# Patient Record
Sex: Male | Born: 1950 | Race: White | Hispanic: No | Marital: Married | Smoking: Current every day smoker
Health system: Southern US, Community
[De-identification: ages and names within clinical notes are randomized; demographics above are authoritative.]

## PROBLEM LIST (undated history)

## (undated) DIAGNOSIS — I10 Essential (primary) hypertension: Secondary | ICD-10-CM

## (undated) DIAGNOSIS — F17200 Nicotine dependence, unspecified, uncomplicated: Secondary | ICD-10-CM

## (undated) HISTORY — DX: Essential (primary) hypertension: I10

---

## 2000-11-01 ENCOUNTER — Encounter: Payer: Self-pay | Admitting: Family Medicine

## 2000-11-01 ENCOUNTER — Encounter: Admission: RE | Admit: 2000-11-01 | Discharge: 2000-11-01 | Payer: Self-pay | Admitting: Family Medicine

## 2002-12-12 ENCOUNTER — Encounter: Admission: RE | Admit: 2002-12-12 | Discharge: 2002-12-12 | Payer: Self-pay | Admitting: Family Medicine

## 2002-12-12 ENCOUNTER — Encounter: Payer: Self-pay | Admitting: Family Medicine

## 2019-04-01 ENCOUNTER — Emergency Department (HOSPITAL_COMMUNITY): Payer: Medicare PPO

## 2019-04-01 ENCOUNTER — Other Ambulatory Visit: Payer: Self-pay

## 2019-04-01 ENCOUNTER — Emergency Department (HOSPITAL_COMMUNITY)
Admission: EM | Admit: 2019-04-01 | Discharge: 2019-04-01 | Disposition: A | Discharge status: Home / Self Care | Payer: Medicare PPO | Attending: Emergency Medicine | Admitting: Emergency Medicine

## 2019-04-01 ENCOUNTER — Encounter (HOSPITAL_COMMUNITY): Payer: Self-pay | Admitting: Emergency Medicine

## 2019-04-01 DIAGNOSIS — M25512 Pain in left shoulder: Secondary | ICD-10-CM | POA: Diagnosis present

## 2019-04-01 DIAGNOSIS — Z79899 Other long term (current) drug therapy: Secondary | ICD-10-CM | POA: Diagnosis not present

## 2019-04-01 DIAGNOSIS — M19012 Primary osteoarthritis, left shoulder: Secondary | ICD-10-CM | POA: Diagnosis not present

## 2019-04-01 DIAGNOSIS — F17210 Nicotine dependence, cigarettes, uncomplicated: Secondary | ICD-10-CM | POA: Insufficient documentation

## 2019-04-01 DIAGNOSIS — R0789 Other chest pain: Secondary | ICD-10-CM | POA: Diagnosis not present

## 2019-04-01 HISTORY — DX: Nicotine dependence, unspecified, uncomplicated: F17.200

## 2019-04-01 LAB — BASIC METABOLIC PANEL
Anion gap: 12 (ref 5–15)
BUN: 10 mg/dL (ref 8–23)
CO2: 26 mmol/L (ref 22–32)
Calcium: 9.5 mg/dL (ref 8.9–10.3)
Chloride: 102 mmol/L (ref 98–111)
Creatinine, Ser: 1.04 mg/dL (ref 0.61–1.24)
GFR calc Af Amer: >60 mL/min (ref >60)
GFR calc non Af Amer: >60 mL/min (ref >60)
Glucose, Bld: 101 mg/dL — ABNORMAL HIGH (ref 70–99)
Potassium: 3.7 mmol/L (ref 3.5–5.1)
Sodium: 140 mmol/L (ref 135–145)

## 2019-04-01 LAB — CBC
HCT: 44.2 % (ref 39.0–52.0)
Hemoglobin: 14.7 g/dL (ref 13.0–17.0)
MCH: 29.8 pg (ref 26.0–34.0)
MCHC: 33.3 g/dL (ref 30.0–36.0)
MCV: 89.7 fL (ref 80.0–100.0)
Platelets: 269 10*3/uL (ref 150–400)
RBC: 4.93 MIL/uL (ref 4.22–5.81)
RDW: 13.2 % (ref 11.5–15.5)
WBC: 12.6 10*3/uL — ABNORMAL HIGH (ref 4.0–10.5)
nRBC: 0 % (ref 0.0–0.2)

## 2019-04-01 LAB — TROPONIN I: Troponin I: <0.03 ng/mL (ref <0.03)

## 2019-04-01 MED ORDER — ASPIRIN 81 MG PO CHEW
324.0000 mg | CHEWABLE_TABLET | Freq: Once | ORAL | Status: DC
  Filled 2019-04-01: qty 4

## 2019-04-01 NOTE — Discharge Instructions (Signed)
Follow up with your doctor as discussed. Return to the ER for any new or worsening symptoms for further evaluation.

## 2019-04-01 NOTE — ED Triage Notes (Signed)
Pt reports he has left upper arm pain to about a 2 inch radius that is not radiating, no association with movement and no palpitations or chest pain. Pt states he has arthritis and thinks it could be related.

## 2019-04-01 NOTE — ED Provider Notes (Signed)
[facility] EMERGENCY DEPARTMENT Provider Note   CSN: [id number] Arrival date & time: 04/01/19  1041    History   Chief Complaint Chief Complaint  Patient presents with   Arm Pain    HPI Jermaine Wilson is a 68 y.o. male.  HPI: A 68 year old patient presents for evaluation of chest pain. Initial onset of pain was approximately 3-6 hours ago. The patient's chest pain is not worse with exertion. The patient's chest pain is not middle- or left-sided, is not well-localized, is not described as heaviness/pressure/tightness, is not sharp and does not radiate to the arms/jaw/neck. The patient does not complain of nausea and denies diaphoresis. The patient has smoked in the past 90 days. The patient has no history of stroke, has no history of peripheral artery disease, denies any history of treated diabetes, has no relevant family history of coronary artery disease (first degree relative at less than age 30), is not hypertensive, has no history of hypercholesterolemia and does not have an elevated BMI (>=30).   68 year old male presents with complaint of left shoulder discomfort.  Patient reports intermittent discomfort in his left proximal lateral humerus/deltoid area for the past 4 to 6 months.  Patient states that he notices this discomfort on average once weekly, rates discomfort as a 3-4 at most.  Patient states that he sleeps on his right side and has a hard time getting his left arm into a comfortable position to sleep at night, notices the discomfort periodically throughout the day, nothing makes pain better or worse, described as a bruised sensation.  Denies discomfort with exertion, denies chest pain, shortness of breath, diaphoresis, nausea.  No significant past medical history, states possibly hypertension and high cholesterol at one point however improved with diet and no longer a problem, not on medication.  Patient is a pack-a-day smoker.  No significant family history.   Patient noticed the discomfort in his shoulder today and called his doctor's office with hopes for an appointment tomorrow however was advised to come to the ER with complaint of left shoulder pain.  Patient attributes his elevated heart rate and blood pressure on arrival to feeling anxious.  At the time of exam patient does not have any discomfort in his shoulder. No other compalints or concerns.      Past Medical History:  Diagnosis Date   Smoker     There are no active problems to display for this patient.   History reviewed. No pertinent surgical history.      Home Medications    Prior to Admission medications   Medication Sig Start Date End Date Taking? Authorizing Provider  cholecalciferol (VITAMIN D) 25 MCG (1000 UT) tablet Take 1,000 Units by mouth daily.   Yes [provider]  diclofenac (VOLTAREN) 50 MG EC tablet Take 50 mg by mouth 2 (two) times daily.  10/31/18  Yes [provider]  Multiple Vitamins-Minerals (MULTIVITAMIN WITH MINERALS) tablet Take 1 tablet by mouth daily.   Yes [provider]  Omega-3 Fatty Acids (FISH OIL) 1000 MG CAPS Take 1,000 mg by mouth daily.   Yes [provider]    Family History No family history on file.  Social History Social History   Tobacco Use   Smoking status: Not on file  Substance Use Topics   Alcohol use: Not on file   Drug use: Not on file     Allergies   Patient has no known allergies.   Review of Systems Review of  Systems  Constitutional: Negative for chills, diaphoresis and fever.  Respiratory: Negative for chest tightness and shortness of breath.   Cardiovascular: Negative for chest pain and palpitations.  Gastrointestinal: Negative for nausea and vomiting.  Musculoskeletal: Positive for myalgias. Negative for joint swelling, neck pain and neck stiffness.  Skin: Negative for color change, rash and wound.  Allergic/Immunologic: Negative for immunocompromised state.    Neurological: Negative for dizziness, weakness and numbness.  Hematological: Negative for adenopathy.  All other systems reviewed and are negative.    Physical Exam Updated Vital Signs BP 139/87 (BP Location: Right Arm)    Pulse 89    Temp 98.2 F (36.8 C) (Oral)    Resp 18    Ht 5\' 9"  (1.753 m)    Wt 83.9 kg    SpO2 96%    BMI 27.32 kg/m   Physical Exam Vitals signs and nursing note reviewed.  Constitutional:      General: He is not in acute distress.    Appearance: He is well-developed. He is not diaphoretic.  HENT:     Head: Normocephalic and atraumatic.  Cardiovascular:     Rate and Rhythm: Normal rate and regular rhythm.     Pulses: Normal pulses.     Heart sounds: Normal heart sounds.  Pulmonary:     Effort: Pulmonary effort is normal.     Breath sounds: Normal breath sounds.  Musculoskeletal: Normal range of motion.        General: No swelling, tenderness or deformity.     Comments: Notes discomfort after ROM  Skin:    General: Skin is warm and dry.     Findings: No erythema or rash.  Neurological:     General: No focal deficit present.     Mental Status: He is alert and oriented to person, place, and time.     Sensory: No sensory deficit.  Psychiatric:        Behavior: Behavior normal.      ED Treatments / Results  Labs (all labs ordered are listed, but only abnormal results are displayed) Labs Reviewed  BASIC METABOLIC PANEL - Abnormal; Notable for the following components:      Result Value   Glucose, Bld 101 (*)    All other components within normal limits  CBC - Abnormal; Notable for the following components:   WBC 12.6 (*)    All other components within normal limits  TROPONIN I  TROPONIN I    EKG EKG Interpretation  Date/Time:  Sunday Apr 01 2019 11:43:03 EDT Ventricular Rate:  94 PR Interval:    QRS Duration: 111 QT Interval:  361 QTC Calculation: 452 R Axis:   73 Text Interpretation:  Sinus rhythm no prior to compare with Confirmed  by Meridee ScoreButler, Michael [phone]) on 04/01/2019 11:46:33 AM Also confirmed by Meridee ScoreButler, Michael [phone]), editor Barbette Hairassel, Kerry [phone]([id number])  on 04/01/2019 11:59:23 AM   Radiology Dg Chest 2 View  Result Date: 04/01/2019 CLINICAL DATA:  Left upper arm pain. EXAM: CHEST - 2 VIEW COMPARISON:  None. FINDINGS: There is a calcified nodule in the left base. The heart, hila, and mediastinum are normal. No pneumothorax. No suspicious nodules, masses, or focal infiltrates. IMPRESSION: No active cardiopulmonary disease. Electronically Signed   By: Gerome Samavid  Williams III M.D   On: 04/01/2019 12:01   Dg Shoulder Left  Result Date: 04/01/2019 CLINICAL DATA:  Left upper arm pain. EXAM: LEFT SHOULDER - 2+ VIEW COMPARISON:  None. FINDINGS: Mild AC joint degenerative changes. Degenerative changes  at the rotator cuff insertion site. No fracture or dislocation. IMPRESSION: Degenerative changes. Electronically Signed   By: Gerome Sam III M.D   On: 04/01/2019 12:02    Procedures Procedures (including critical care time)  Medications Ordered in ED Medications  aspirin chewable tablet 324 mg (324 mg Oral Not Given 04/01/19 1222)     Initial Impression / Assessment and Plan / ED Course  I have reviewed the triage vital signs and the nursing notes.  Pertinent labs & imaging results that were available during my care of the patient were reviewed by me and considered in my medical decision making (see chart for details).  Clinical Course as of Mar 31 1349  Sun Apr 01, 2019  4636 68 year old male presents with complaint of a dull left shoulder discomfort intermittent x4 to 6 months without injury.  Patient denies chest pain or other associated symptoms.  On exam there is no tenderness in the area of patient's deltoid where he says the discomfort occurs, he does not have pain at this time however after performing range of motion of the shoulder he reports the area is uncomfortable again.  Patient is a daily smoker, no other cardiac  risk factors, no significant past medical history or family history.  EKG unremarkable, no previous for comparison, no acute ischemic changes.  Troponin x1-, BMP within normal limits, CBC with mild leukocytosis without infection concerns.  Chest x-ray unremarkable, x-ray of left shoulder shows arthritic changes.  Reviewed results with patient, recommend repeat troponin at 3 hours, patient declines discussed with patient concern for discomfort that has been ongoing for 4 to 6 months which prompted him to call his doctor today and follow through with ER visit, patient truly feels like there was nothing new or different about his discomfort that prompted the phone call and nothing about his symptoms are concerning to him at this point.  Patient would like to follow-up with his PCP and agrees to return to the ER should he have any new or worsening of his symptoms.  Patient understands his cardiac workup today is incomplete, repeat troponin could be elevated and would change the disposition for today, he would still like to be discharged today and understands the decision he has made.    [LM]    Clinical Course User Index [LM] Alden Hipp    [facility] Score: 3   Final Clinical Impressions(s) / ED Diagnoses   Final diagnoses:  Left shoulder pain, unspecified chronicity  Osteoarthritis of left shoulder, unspecified osteoarthritis type    ED Discharge Orders    None       Jeannie Fend, PA-C 04/01/19 1351    Terrilee Files, MD 04/03/19 [phone]

## 2019-12-12 ENCOUNTER — Ambulatory Visit: Payer: Medicare PPO

## 2019-12-20 ENCOUNTER — Ambulatory Visit: Payer: Medicare PPO

## 2019-12-24 ENCOUNTER — Ambulatory Visit: Payer: Medicare PPO | Attending: Internal Medicine

## 2019-12-24 DIAGNOSIS — Z23 Encounter for immunization: Secondary | ICD-10-CM | POA: Insufficient documentation

## 2019-12-24 NOTE — Progress Notes (Signed)
   Covid-19 Vaccination Clinic  Name:  Jermaine Wilson    MRN: [id number] DOB: 1951/09/19  12/24/2019  [name] was observed post Covid-19 immunization for 15 minutes without incidence. He was provided with Vaccine Information Sheet and instruction to access the V-Safe system.   [name] was instructed to call 911 with any severe reactions post vaccine: Marland Kitchen Difficulty breathing  . Swelling of your face and throat  . A fast heartbeat  . A bad rash all over your body  . Dizziness and weakness    Immunizations Administered    Name Date Dose VIS Date Route   Pfizer COVID-19 Vaccine 12/24/2019  9:16 AM 0.3 mL 10/26/2019 Intramuscular   Manufacturer: [employer], [employer]   Lot: [id number]   NDC: [address]-7

## 2020-01-02 ENCOUNTER — Ambulatory Visit: Payer: Medicare PPO

## 2020-01-17 ENCOUNTER — Ambulatory Visit: Payer: Medicare PPO | Attending: Internal Medicine

## 2020-01-17 DIAGNOSIS — Z23 Encounter for immunization: Secondary | ICD-10-CM | POA: Insufficient documentation

## 2020-01-17 NOTE — Progress Notes (Signed)
   Covid-19 Vaccination Clinic  Name:  Jermaine Wilson    MRN: [id number] DOB: 05/10/1951  01/17/2020  Mr. Jermaine Wilson was observed post Covid-19 immunization for 15 minutes without incident. He was provided with Vaccine Information Sheet and instruction to access the V-Safe system.   Mr. Jermaine Wilson was instructed to call 911 with any severe reactions post vaccine: Marland Kitchen Difficulty breathing  . Swelling of face and throat  . A fast heartbeat  . A bad rash all over body  . Dizziness and weakness   Immunizations Administered    Name Date Dose VIS Date Route   Pfizer COVID-19 Vaccine 01/17/2020  4:28 PM 0.3 mL 10/26/2019 Intramuscular   Manufacturer: [employer], [employer]   Lot: [id number]   NDC: [address]-6

## 2021-01-15 ENCOUNTER — Other Ambulatory Visit: Payer: Self-pay

## 2021-01-15 ENCOUNTER — Encounter: Payer: Self-pay | Admitting: Podiatry

## 2021-01-15 ENCOUNTER — Ambulatory Visit: Payer: Medicare PPO | Admitting: Podiatry

## 2021-01-15 ENCOUNTER — Telehealth: Payer: Self-pay | Admitting: Podiatry

## 2021-01-15 DIAGNOSIS — M79674 Pain in right toe(s): Secondary | ICD-10-CM | POA: Diagnosis not present

## 2021-01-15 DIAGNOSIS — M79675 Pain in left toe(s): Secondary | ICD-10-CM

## 2021-01-15 DIAGNOSIS — B351 Tinea unguium: Secondary | ICD-10-CM | POA: Diagnosis not present

## 2021-01-15 DIAGNOSIS — L600 Ingrowing nail: Secondary | ICD-10-CM | POA: Diagnosis not present

## 2021-01-15 NOTE — Telephone Encounter (Signed)
Pt had questions about soaking instructions. I went over them with him, and he verbalized that he understood.

## 2021-01-15 NOTE — Progress Notes (Signed)
Subjective:   Patient ID: Jermaine Wilson, male   DOB: 70 y.o.   MRN: [id number]   HPI Patient states he has severe nail trauma to his hallux bilateral with abnormal growth pain with palpation and is also noted to have elongated nails that are painful yellow with brittle debris noted.  He states its been going on a long time and he does not remember specific trauma and patient does not currently smoke likes to be active if possible   Review of Systems  All other systems reviewed and are negative.       Objective:  Physical Exam Vitals and nursing note reviewed.  Constitutional:      Appearance: He is well-developed and well-nourished.  Cardiovascular:     Pulses: Intact distal pulses.  Pulmonary:     Effort: Pulmonary effort is normal.  Musculoskeletal:        General: Normal range of motion.  Skin:    General: Skin is warm.  Neurological:     Mental Status: He is alert.     Neurovascular status intact muscle strength was found to be adequate range of motion adequate.  Patient is noted to have severe damage thickness of the hallux nails bilateral that are dystrophic painful when palpated and is noted on the adjacent nails 2 through 5 to be elongated irritated and painful.  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  I recommended nail removal at this time and patient wants this done and I explained procedure risk associated with this and he signed consent form after education.  Today I infiltrated each hallux 60 mg like Marcaine mixture sterile prep done and using sterile instrumentation remove the hallux nail exposed matrix and applied phenol for applications 30 seconds followed by alcohol lavage sterile dressing.  Gave instructions on soaks and I debrided nails 2 through 5 bilateral no iatrogenic bleeding  reappoint routine care encouraged to leave dressings on 24 hours but take them off earlier if needed and encouraged him to call questions concerns

## 2021-01-15 NOTE — Patient Instructions (Signed)

## 2021-02-13 HISTORY — PX: TOTAL HIP ARTHROPLASTY: SHX124

## 2021-03-16 ENCOUNTER — Observation Stay (HOSPITAL_COMMUNITY)
Admission: EM | Admit: 2021-03-16 | Discharge: 2021-03-17 | Disposition: A | Discharge status: Home / Self Care | Payer: Medicare PPO | Attending: Family Medicine | Admitting: Family Medicine

## 2021-03-16 ENCOUNTER — Other Ambulatory Visit: Payer: Self-pay

## 2021-03-16 ENCOUNTER — Encounter (HOSPITAL_COMMUNITY): Payer: Self-pay

## 2021-03-16 ENCOUNTER — Emergency Department (HOSPITAL_COMMUNITY): Payer: Medicare PPO

## 2021-03-16 DIAGNOSIS — R002 Palpitations: Secondary | ICD-10-CM | POA: Diagnosis present

## 2021-03-16 DIAGNOSIS — Z20822 Contact with and (suspected) exposure to covid-19: Secondary | ICD-10-CM | POA: Diagnosis not present

## 2021-03-16 DIAGNOSIS — I4891 Unspecified atrial fibrillation: Secondary | ICD-10-CM | POA: Diagnosis not present

## 2021-03-16 DIAGNOSIS — Z96641 Presence of right artificial hip joint: Secondary | ICD-10-CM | POA: Insufficient documentation

## 2021-03-16 DIAGNOSIS — R0602 Shortness of breath: Secondary | ICD-10-CM | POA: Insufficient documentation

## 2021-03-16 DIAGNOSIS — I480 Paroxysmal atrial fibrillation: Secondary | ICD-10-CM

## 2021-03-16 LAB — CBC WITH DIFFERENTIAL/PLATELET
Abs Immature Granulocytes: 0.03 10*3/uL (ref 0.00–0.07)
Basophils Absolute: 0.1 10*3/uL (ref 0.0–0.1)
Basophils Relative: 1 %
Eosinophils Absolute: 0.2 10*3/uL (ref 0.0–0.5)
Eosinophils Relative: 2 %
HCT: 44.3 % (ref 39.0–52.0)
Hemoglobin: 14.1 g/dL (ref 13.0–17.0)
Immature Granulocytes: 0 %
Lymphocytes Relative: 17 %
Lymphs Abs: 2.1 10*3/uL (ref 0.7–4.0)
MCH: 30.5 pg (ref 26.0–34.0)
MCHC: 31.8 g/dL (ref 30.0–36.0)
MCV: 95.9 fL (ref 80.0–100.0)
Monocytes Absolute: 0.9 10*3/uL (ref 0.1–1.0)
Monocytes Relative: 7 %
Neutro Abs: 9 10*3/uL — ABNORMAL HIGH (ref 1.7–7.7)
Neutrophils Relative %: 73 %
Platelets: 451 10*3/uL — ABNORMAL HIGH (ref 150–400)
RBC: 4.62 MIL/uL (ref 4.22–5.81)
RDW: 13.7 % (ref 11.5–15.5)
WBC: 12.3 10*3/uL — ABNORMAL HIGH (ref 4.0–10.5)
nRBC: 0 % (ref 0.0–0.2)

## 2021-03-16 LAB — COMPREHENSIVE METABOLIC PANEL
ALT: 21 U/L (ref 0–44)
AST: 20 U/L (ref 15–41)
Albumin: 3.8 g/dL (ref 3.5–5.0)
Alkaline Phosphatase: 96 U/L (ref 38–126)
Anion gap: 10 (ref 5–15)
BUN: 11 mg/dL (ref 8–23)
CO2: 26 mmol/L (ref 22–32)
Calcium: 9.7 mg/dL (ref 8.9–10.3)
Chloride: 102 mmol/L (ref 98–111)
Creatinine, Ser: 1 mg/dL (ref 0.61–1.24)
GFR, Estimated: >60 mL/min (ref >60)
Glucose, Bld: 115 mg/dL — ABNORMAL HIGH (ref 70–99)
Potassium: 4.1 mmol/L (ref 3.5–5.1)
Sodium: 138 mmol/L (ref 135–145)
Total Bilirubin: 1 mg/dL (ref 0.3–1.2)
Total Protein: 6.9 g/dL (ref 6.5–8.1)

## 2021-03-16 LAB — RESP PANEL BY RT-PCR (FLU A&B, COVID) ARPGX2
Influenza A by PCR: NEGATIVE
Influenza B by PCR: NEGATIVE
SARS Coronavirus 2 by RT PCR: NEGATIVE

## 2021-03-16 LAB — LIPID PANEL
Cholesterol: 235 mg/dL — ABNORMAL HIGH (ref 0–200)
HDL: 40 mg/dL — ABNORMAL LOW (ref >40)
LDL Cholesterol: 172 mg/dL — ABNORMAL HIGH (ref 0–99)
Total CHOL/HDL Ratio: 5.9 RATIO
Triglycerides: 115 mg/dL (ref <150)
VLDL: 23 mg/dL (ref 0–40)

## 2021-03-16 LAB — T4, FREE: Free T4: 1.18 ng/dL — ABNORMAL HIGH (ref 0.61–1.12)

## 2021-03-16 LAB — TSH: TSH: 0.569 u[IU]/mL (ref 0.350–4.500)

## 2021-03-16 MED ORDER — DICLOFENAC SODIUM 50 MG PO TBEC: 50.0000 mg | DELAYED_RELEASE_TABLET | Freq: Two times a day (BID) | ORAL | Status: DC

## 2021-03-16 MED ORDER — VITAMIN D 25 MCG (1000 UNIT) PO TABS
1000.0000 [IU] | ORAL_TABLET | Freq: Every day | ORAL | Status: DC
  Filled 2021-03-16: qty 1

## 2021-03-16 MED ORDER — OXYCODONE HCL 5 MG PO TABS: 5.0000 mg | ORAL_TABLET | Freq: Four times a day (QID) | ORAL | Status: DC | PRN

## 2021-03-16 MED ORDER — POLYETHYLENE GLYCOL 3350 17 G PO PACK: 17.0000 g | PACK | Freq: Every day | ORAL | Status: DC

## 2021-03-16 MED ORDER — ADULT MULTIVITAMIN W/MINERALS CH: 1.0000 | ORAL_TABLET | Freq: Every day | ORAL | Status: DC

## 2021-03-16 MED ORDER — DILTIAZEM HCL-DEXTROSE 125-5 MG/125ML-% IV SOLN (PREMIX)
5.0000 mg/h | INTRAVENOUS | Status: DC
  Administered 2021-03-16: 5 mg/h via INTRAVENOUS
  Administered 2021-03-17: 10 mg/h via INTRAVENOUS
  Filled 2021-03-16 (×2): qty 125

## 2021-03-16 MED ORDER — HEPARIN BOLUS VIA INFUSION
3000.0000 [IU] | Freq: Once | INTRAVENOUS | Status: AC
  Administered 2021-03-16: 3000 [IU] via INTRAVENOUS
  Filled 2021-03-16: qty 3000

## 2021-03-16 MED ORDER — ONDANSETRON HCL 4 MG/2ML IJ SOLN: 4.0000 mg | Freq: Four times a day (QID) | INTRAMUSCULAR | Status: DC | PRN

## 2021-03-16 MED ORDER — DOCUSATE SODIUM 100 MG PO CAPS: 100.0000 mg | ORAL_CAPSULE | Freq: Two times a day (BID) | ORAL | Status: DC | PRN

## 2021-03-16 MED ORDER — HEPARIN (PORCINE) 25000 UT/250ML-% IV SOLN
1300.0000 [IU]/h | INTRAVENOUS | Status: DC
  Administered 2021-03-16: 1100 [IU]/h via INTRAVENOUS
  Filled 2021-03-16: qty 250

## 2021-03-16 MED ORDER — SODIUM CHLORIDE 0.9 % IV SOLN: INTRAVENOUS | Status: DC

## 2021-03-16 MED ORDER — METOPROLOL TARTRATE 5 MG/5ML IV SOLN
5.0000 mg | Freq: Once | INTRAVENOUS | Status: AC
  Administered 2021-03-16: 5 mg via INTRAVENOUS
  Filled 2021-03-16: qty 5

## 2021-03-16 MED ORDER — IOHEXOL 350 MG/ML SOLN
75.0000 mL | Freq: Once | INTRAVENOUS | Status: AC | PRN
  Administered 2021-03-16: 75 mL via INTRAVENOUS

## 2021-03-16 MED ORDER — ACETAMINOPHEN 325 MG PO TABS: 650.0000 mg | ORAL_TABLET | ORAL | Status: DC | PRN

## 2021-03-16 NOTE — ED Provider Notes (Signed)
Emergency Medicine Provider Triage Evaluation Note  Jermaine Wilson 70 y.o. M  was evaluated in triage.  Pt complains of palpitations. Patient sent over by PCP who he was seeing for evaluation of his postop issues.  He reports that at PCPs office, he was found to be tachycardic and was sent to the ED for further evaluation.  He states he notes last night, he was having some palpitations.  No chest pain, shortness of breath.  No fevers, abdominal pain.  He states his incision site has been clean without any signs of erythema, warmth, drainage.  Review of Systems  Positive: Palpitations Negative: Chest pain, shortness of breath, abdominal pain, fever  Physical Exam  BP 134/82   Pulse 70   Temp 98.2 F (36.8 C) (Oral)   Resp 18   Ht 5\' 4"  (1.626 m)   Wt 65.8 kg   SpO2 100%   BMI 24.89 kg/m  Gen:   Awake, no distress   HEENT:  Atraumatic  Resp:  Normal effort  Cardiac:  Regular rhythm, tachycardic Abd:   Nondistended, nontender  MSK:   Moves extremities without difficulty  Neuro:  Speech clear   Medical Decision Making  Medically screening exam initiated at 3:55 AM.  Appropriate orders placed.  Jermaine Wilson was informed that the remainder of the evaluation will be completed by another provider, this initial triage assessment does not replace that evaluation, and the importance of remaining in the ED until their evaluation is complete.  12:09 PM: Notified charge nurse (renal) the patient was in SVT and should be evaluated in the ED.  Clinical Impression  Palpitations   Portions of this note were generated with Dragon dictation software. Dictation errors may occur despite best attempts at proofreading.     Joline Salt, PA-C 03/16/21 1210    05/16/21, MD 03/17/21 [phone]

## 2021-03-16 NOTE — Progress Notes (Signed)
ANTICOAGULATION CONSULT NOTE - Initial Consult  Pharmacy Consult for heparin Indication: atrial flutter vs. VTE  No Known Allergies  Patient Measurements: Height: 5\' 9"  (175.3 cm) Weight: 76.7 kg (169 lb) IBW/kg (Calculated) : 70.7 Heparin Dosing Weight: 76.7 kg  Vital Signs: Temp: 97.6 F (36.4 C) (05/02 1545) Temp Source: Oral (05/02 1545) BP: 117/96 (05/02 1530) Pulse Rate: 104 (05/02 1530)  Labs: Recent Labs    03/16/21 1227  HGB 14.1  HCT 44.3  PLT 451*  CREATININE 1.00    Estimated Creatinine Clearance: 67.8 mL/min (by C-G formula based on SCr of 1 mg/dL).   Medical History: Past Medical History:  Diagnosis Date  . Smoker     Assessment: 27 yom presenting with increased heart rate. Patient s/p hip surgery 3 weeks ago. Chest CT showed paratracheal mass, PE study was negative for PE. 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   Goal of Therapy:  Heparin level 0.3-0.7 units/ml Monitor platelets by anticoagulation protocol: Yes   Plan:  Heparin 3000 units x1  Followed by 1100 units/hr  8-hour heparin level  Daily CBC and HL  Monitor s/sx of bleed  60, PharmD, [facility] Resident [phone] 03/16/2021 4:17 PM

## 2021-03-16 NOTE — Consult Note (Addendum)
CARDIOLOGY CONSULT NOTE  Patient ID: Jermaine Wilson MRN: [id number] DOB/AGE: 72-Aug-1952 41 y.o.  Admit date: 03/16/2021 Referring Physician: [name], MD Reason for Consultation: Tachycardia  HPI:   70 y.o. Caucasian male smoker ,h/o right hip replacement surgery 02/2021, now admitted with A. fib RVR  Patient has been fairly active at baseline for the last 1 year or so when his activity is reduced due to right hip pain.  He underwent hip replacement surgery 3 weeks ago.  Today, he was visiting his PCP with primary complaint of constipation.  His PCP noticed him to be in A. fib with RVR, therefore sent to [facility], ER.  Nonspecific questioning, patient thinks that he started having tachycardia on Saturday, 03/15/2019 2 at night.  He denies any chest pain.  He has mild exertional dyspnea at baseline for many years, with no recent change.  While in the ER, patient was found to be in A. Fib/atrial flutter with RVR.  He was started on IV heparin and diltiazem.  He also underwent CT angiogram of his chest to rule out PE.  There was no PE seen, there was an incidental finding of left paratracheal mass measuring up to 25 mm in diameter-favor exophytic thyroid mass however necrotic lymph node also possible. Recommend thyroid ultrasound. TSH was low normal at 0.5, free T4 mildly elevated at 1.18.   Past Medical History:  Diagnosis Date  . Smoker      Past Surgical History:  Procedure Laterality Date  . TOTAL HIP ARTHROPLASTY Right 02/2021     History reviewed. No pertinent family history.   Social History: Social History   Socioeconomic History  . Marital status: Married    Spouse name: Not on file  . Number of children: Not on file  . Years of education: Not on file  . Highest education level: Not on file  Occupational History  . Not on file  Tobacco Use  . Smoking status: Current Every Day Smoker    Types: Cigarettes  . Smokeless tobacco: Not on file  Substance and Sexual  Activity  . Alcohol use: Not on file  . Drug use: Not on file  . Sexual activity: Not on file  Other Topics Concern  . Not on file  Social History Narrative  . Not on file   Social Determinants of Health   Financial Resource Strain: Not on file  Food Insecurity: Not on file  Transportation Needs: Not on file  Physical Activity: Not on file  Stress: Not on file  Social Connections: Not on file  Intimate Partner Violence: Not on file     Medications Prior to Admission  Medication Sig Dispense Refill Last Dose  . acetaminophen (TYLENOL) 500 MG tablet Take 500 mg by mouth daily as needed (pain).   03/15/2021 at Unknown time  . aspirin 325 MG tablet Take 325 mg by mouth 2 (two) times daily.   03/16/2021 at Unknown time  . cholecalciferol (VITAMIN D) 25 MCG (1000 UT) tablet Take 1,000 Units by mouth daily.   Past Month at Unknown time  . diphenhydramine-acetaminophen (TYLENOL PM) 25-500 MG TABS tablet Take 1 tablet by mouth at bedtime as needed (pain).   03/15/2021 at Unknown time  . Multiple Vitamins-Minerals (MULTIVITAMIN WITH MINERALS) tablet Take 1 tablet by mouth daily.   03/16/2021 at Unknown time  . Omega-3 Fatty Acids (FISH OIL) 1000 MG CAPS Take 1,000 mg by mouth daily.   unknown  . tiZANidine (ZANAFLEX) 4 MG tablet Take  4 mg by mouth 2 (two) times daily.   03/16/2021 at Unknown time  . diclofenac (VOLTAREN) 50 MG EC tablet Take 50 mg by mouth 3 (three) times daily.   on hold at on hold    Review of Systems  Constitutional: Negative for decreased appetite, malaise/fatigue, weight gain and weight loss.  HENT: Negative for congestion.   Eyes: Negative for visual disturbance.  Cardiovascular: Negative for chest pain, dyspnea on exertion, leg swelling, palpitations and syncope.  Respiratory: Negative for cough.   Endocrine: Negative for cold intolerance.  Hematologic/Lymphatic: Does not bruise/bleed easily.  Skin: Negative for itching and rash.  Musculoskeletal: Negative for myalgias.   Gastrointestinal: Negative for abdominal pain, nausea and vomiting.  Genitourinary: Negative for dysuria.  Neurological: Negative for dizziness and weakness.  Psychiatric/Behavioral: The patient is not nervous/anxious.   All other systems reviewed and are negative.     Physical Exam: Physical Exam Vitals and nursing note reviewed.  Constitutional:      General: He is not in acute distress.    Appearance: He is well-developed.  HENT:     Head: Normocephalic and atraumatic.  Eyes:     Conjunctiva/sclera: Conjunctivae normal.     Pupils: Pupils are equal, round, and reactive to light.  Neck:     Vascular: No JVD.  Cardiovascular:     Rate and Rhythm: Tachycardia present. Rhythm irregular.     Pulses: Normal pulses and intact distal pulses.     Heart sounds: No murmur heard.   Pulmonary:     Effort: Pulmonary effort is normal.     Breath sounds: Normal breath sounds. No wheezing or rales.  Abdominal:     General: Bowel sounds are normal.     Palpations: Abdomen is soft.     Tenderness: There is no rebound.  Musculoskeletal:        General: No tenderness. Normal range of motion.     Left lower leg: No edema.  Lymphadenopathy:     Cervical: No cervical adenopathy.  Skin:    General: Skin is warm and dry.  Neurological:     Mental Status: He is alert and oriented to person, place, and time.     Cranial Nerves: No cranial nerve deficit.      Labs:   Lab Results  Component Value Date   WBC 12.3 (H) 03/16/2021   HGB 14.1 03/16/2021   HCT 44.3 03/16/2021   MCV 95.9 03/16/2021   PLT 451 (H) 03/16/2021    Recent Labs  Lab 03/16/21 1227  NA 138  K 4.1  CL 102  CO2 26  BUN 11  CREATININE 1.00  CALCIUM 9.7  PROT 6.9  BILITOT 1.0  ALKPHOS 96  ALT 21  AST 20  GLUCOSE 115*    Lipid Panel     Component Value Date/Time   CHOL 235 (H) 03/16/2021 1624   TRIG 115 03/16/2021 1624   HDL 40 (L) 03/16/2021 1624   CHOLHDL 5.9 03/16/2021 1624   VLDL 23  03/16/2021 1624   LDLCALC 172 (H) 03/16/2021 1624    BNP (last 3 results) N/A  HEMOGLOBIN A1C N/A  Cardiac Panel (last 3 results) N/A  TSH Recent Labs    03/16/21 1229  TSH 0.569      Radiology: CT Angio Chest PE W/Cm &/Or Wo Cm  Result Date: 03/16/2021 CLINICAL DATA:  Short of breath. Tachycardia rule out pulmonary embolism. EXAM: CT ANGIOGRAPHY CHEST WITH CONTRAST TECHNIQUE: Multidetector CT imaging of the chest was  performed using the standard protocol during bolus administration of intravenous contrast. Multiplanar CT image reconstructions and MIPs were obtained to evaluate the vascular anatomy. CONTRAST:  31mL OMNIPAQUE IOHEXOL 350 MG/ML SOLN COMPARISON:  Chest x-ray 03/16/2021 FINDINGS: Cardiovascular: Satisfactory enhancement of pulmonary arteries. Negative for pulmonary embolism. No significant enhancement of the thoracic aorta. There is mild atherosclerotic disease in the aorta without aneurysm. There is atherosclerotic calcification in the left coronary artery. No pericardial effusion. 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 This is low density centrally and may be attached to the posterior left thyroid. No other mediastinal mass or adenopathy identified. Lungs/Pleura: Calcified granuloma left lower lobe. No acute infiltrate or effusion. Negative for mass lesion. Upper Abdomen: Negative Musculoskeletal: Thoracic disc degeneration and anterior spurring. No acute skeletal abnormality. Review of the MIP images confirms the above findings. IMPRESSION: Negative for pulmonary embolism. Atherosclerotic calcification aortic arch and coronary arteries. 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 Recommend thyroid ultrasound. (Ref: J Am Coll Radiol. 2015 Feb;12(2): 143-50). Electronically Signed   By: Marlan Palau M.D.   On: 03/16/2021 15:38   DG Chest Port 1  View  Result Date: 03/16/2021 CLINICAL DATA:  Shortness of breath EXAM: PORTABLE CHEST 1 VIEW COMPARISON:  Apr 01, 2019 FINDINGS: There is a calcified granuloma in the left lower lobe. The lungs elsewhere are clear. Heart size and pulmonary vascularity are normal. No adenopathy. There is degenerative change in the thoracic spine. IMPRESSION: Calcified granuloma left lower lobe. No edema or airspace opacity. Cardiac silhouette within normal limits. Electronically Signed   By: Bretta Bang III M.D.   On: 03/16/2021 13:04    Scheduled Meds: . [START ON 03/17/2021] cholecalciferol  1,000 Units Oral Daily  . [START ON 03/17/2021] multivitamin with minerals  1 tablet Oral Daily  . polyethylene glycol  17 g Oral Daily   Continuous Infusions: . sodium chloride 125 mL/hr at 03/16/21 1253  . diltiazem (CARDIZEM) infusion 10 mg/hr (03/16/21 1632)  . heparin 1,100 Units/hr (03/16/21 1638)   PRN Meds:.acetaminophen, docusate sodium, ondansetron (ZOFRAN) IV, oxyCODONE  CARDIAC STUDIES:  EKG 03/16/2021: A. fib with RVR 156 bpm Right bundle branch block  Repeat EKG 03/16/2021: Possible atypical atrial flutter with variable conduction Ventricular rate 124 bpm Right bundle branch block   Echocardiogram pending:  Assessment & Recommendations:  70 y.o. Caucasian male smoker ,h/o right hip replacement surgery 02/2021, now admitted with A. fib RVR  A. fib with RVR: Likely recent onset, about 48 hours Precipitating etiology possibly hypothyroidism Given semi-acute onset, and possibility of hyperthyroidism, do not recommend immediate cardioversion. Recommend IV diltiazem for rate control.  We will add p.o. metoprolol and/or diltiazem in the morning, if rate not controlled Avoid amiodarone given possible hyperthyroidism CHA2DS2-VASc score 1, annual stroke risk 0.6%.  As such, long-term anticoagulation is not required.  However, given possibility of rhythm control therapies, including cardioversion in the  near future, recommend anticoagulation for now.  Currently on IV heparin, could switch to p.o. Eliquis.  I would recommend stopping aspirin given initiation of Eliquis. Defer work-up and management of hypothyroidism to primary team.    Elder Negus, MD Pager: [phone] Office: [phone]

## 2021-03-16 NOTE — H&P (Signed)
History and Physical    Jermaine Wilson NWG:[id number] DOB: 01-14-1951 DOA: 03/16/2021  PCP: Eartha Inch, MD (Confirm with patient/family/NH records and if not entered, this has to be entered at [facility] point of entry) Patient coming from: Home  I have personally briefly reviewed patient's old medical records in [facility]  Chief Complaint: Feeling ok  HPI: Jermaine Wilson is a 70 y.o. male with medical history significant of borderline hypertension, borderline hyperlipidemia, OA status post right hip replacement 3 weeks ago, presented with new onset A. Fib.  Patient went to see PCP for worsening of constipation from narcotic use after hip surgery, physical exam and EKG showed patient had new onset A. fib and sent to ED.  Patient denies any palpitations, no chest pain or shortness of breath.  ED Course: EKG showed a flutter with 2:1 block. TSH WNL. CTA negative for PE but accidental finding of paratracheal mass 2.5 cm.  Review of Systems: As per HPI otherwise 10 point review of systems negative.  Unacceptable ROS statements: "10 systems reviewed," "Extensive" (without elaboration).  Acceptable ROS statements: "All others negative," "All others reviewed and are negative," and "All others unremarkable," with at LEAST ONE ROS documented Can't double dip - if using for HPI can't use for ROS  Past Medical History:  Diagnosis Date  . Smoker     History reviewed. No pertinent surgical history.   has no history on file for tobacco use, alcohol use, and drug use.  No Known Allergies  No family history on file.    Prior to Admission medications   Medication Sig Start Date End Date Taking? Authorizing Provider  cholecalciferol (VITAMIN D) 25 MCG (1000 UT) tablet Take 1,000 Units by mouth daily.    [provider]  diclofenac (VOLTAREN) 50 MG EC tablet Take 50 mg by mouth 2 (two) times daily.  10/31/18   [provider]  Multiple Vitamins-Minerals (MULTIVITAMIN  WITH MINERALS) tablet Take 1 tablet by mouth daily.    [provider]  Omega-3 Fatty Acids (FISH OIL) 1000 MG CAPS Take 1,000 mg by mouth daily. Patient not taking: Reported on 01/15/2021    [provider]    Physical Exam: Vitals:   03/16/21 1530 03/16/21 1545 03/16/21 1545 03/16/21 1600  BP: (!) 117/96   (!) 143/106  Pulse: (!) 104 (!) 114  (!) 122  Resp: (!) 33 19  18  Temp:   97.6 F (36.4 C)   TempSrc:   Oral   SpO2: 99% 99%  98%  Weight:      Height:        Constitutional: NAD, calm, comfortable Vitals:   03/16/21 1530 03/16/21 1545 03/16/21 1545 03/16/21 1600  BP: (!) 117/96   (!) 143/106  Pulse: (!) 104 (!) 114  (!) 122  Resp: (!) 33 19  18  Temp:   97.6 F (36.4 C)   TempSrc:   Oral   SpO2: 99% 99%  98%  Weight:      Height:       Eyes: PERRL, lids and conjunctivae normal ENMT: Mucous membranes are moist. Posterior pharynx clear of any exudate or lesions.Normal dentition.  Neck: normal, supple, no masses, no thyromegaly Respiratory: clear to auscultation bilaterally, no wheezing, no crackles. Normal respiratory effort. No accessory muscle use.  Cardiovascular: Regular rate and rhythm, no murmurs / rubs / gallops. No extremity edema. 2+ pedal pulses. No carotid bruits.  Abdomen: no tenderness, no masses palpated. No hepatosplenomegaly. Bowel  sounds positive.  Musculoskeletal: no clubbing / cyanosis. No joint deformity upper and lower extremities. Good ROM, no contractures. Normal muscle tone.  Skin: no rashes, lesions, ulcers. No induration Neurologic: CN 2-12 grossly intact. Sensation intact, DTR normal. Strength 5/5 in all 4.  Psychiatric: Normal judgment and insight. Alert and oriented x 3. Normal mood.   (Anything < 9 systems with 2 bullets each down codes to level 1) (If patient refuses exam can't bill higher level) (Make sure to document decubitus ulcers present on admission -- if possible -- and whether patient has chronic indwelling  catheter at time of admission)  Labs on Admission: I have personally reviewed following labs and imaging studies  CBC: Recent Labs  Lab 03/16/21 1227  WBC 12.3*  NEUTROABS 9.0*  HGB 14.1  HCT 44.3  MCV 95.9  PLT 451*   Basic Metabolic Panel: Recent Labs  Lab 03/16/21 1227  NA 138  K 4.1  CL 102  CO2 26  GLUCOSE 115*  BUN 11  CREATININE 1.00  CALCIUM 9.7   GFR: Estimated Creatinine Clearance: 67.8 mL/min (by C-G formula based on SCr of 1 mg/dL). Liver Function Tests: Recent Labs  Lab 03/16/21 1227  AST 20  ALT 21  ALKPHOS 96  BILITOT 1.0  PROT 6.9  ALBUMIN 3.8   No results for input(s): LIPASE, AMYLASE in the last 168 hours. No results for input(s): AMMONIA in the last 168 hours. Coagulation Profile: No results for input(s): INR, PROTIME in the last 168 hours. Cardiac Enzymes: No results for input(s): CKTOTAL, CKMB, CKMBINDEX, TROPONINI in the last 168 hours. BNP (last 3 results) No results for input(s): PROBNP in the last 8760 hours. HbA1C: No results for input(s): HGBA1C in the last 72 hours. CBG: No results for input(s): GLUCAP in the last 168 hours. Lipid Profile: No results for input(s): CHOL, HDL, LDLCALC, TRIG, CHOLHDL, LDLDIRECT in the last 72 hours. Thyroid Function Tests: Recent Labs    03/16/21 1229  TSH 0.569   Anemia Panel: No results for input(s): VITAMINB12, FOLATE, FERRITIN, TIBC, IRON, RETICCTPCT in the last 72 hours. Urine analysis: No results found for: COLORURINE, APPEARANCEUR, LABSPEC, PHURINE, GLUCOSEU, HGBUR, BILIRUBINUR, KETONESUR, PROTEINUR, UROBILINOGEN, NITRITE, LEUKOCYTESUR  Radiological Exams on Admission: CT Angio Chest PE W/Cm &/Or Wo Cm  Result Date: 03/16/2021 CLINICAL DATA:  Short of breath. Tachycardia rule out pulmonary embolism. EXAM: CT ANGIOGRAPHY CHEST WITH CONTRAST TECHNIQUE: Multidetector CT imaging of the chest was performed using the standard protocol during bolus administration of intravenous contrast.  Multiplanar CT image reconstructions and MIPs were obtained to evaluate the vascular anatomy. CONTRAST:  6mL OMNIPAQUE IOHEXOL 350 MG/ML SOLN COMPARISON:  Chest x-ray 03/16/2021 FINDINGS: Cardiovascular: Satisfactory enhancement of pulmonary arteries. Negative for pulmonary embolism. No significant enhancement of the thoracic aorta. There is mild atherosclerotic disease in the aorta without aneurysm. There is atherosclerotic calcification in the left coronary artery. No pericardial effusion. 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 This is low density centrally and may be attached to the posterior left thyroid. No other mediastinal mass or adenopathy identified. Lungs/Pleura: Calcified granuloma left lower lobe. No acute infiltrate or effusion. Negative for mass lesion. Upper Abdomen: Negative Musculoskeletal: Thoracic disc degeneration and anterior spurring. No acute skeletal abnormality. Review of the MIP images confirms the above findings. IMPRESSION: Negative for pulmonary embolism. Atherosclerotic calcification aortic arch and coronary arteries. 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 Recommend  thyroid ultrasound. (Ref: J Am Coll Radiol. 2015 Feb;12(2): 143-50). Electronically Signed   By: Marlan Palau M.D.   On: 03/16/2021 15:38   DG Chest Port 1 View  Result Date: 03/16/2021 CLINICAL DATA:  Shortness of breath EXAM: PORTABLE CHEST 1 VIEW COMPARISON:  Apr 01, 2019 FINDINGS: There is a calcified granuloma in the left lower lobe. The lungs elsewhere are clear. Heart size and pulmonary vascularity are normal. No adenopathy. There is degenerative change in the thoracic spine. IMPRESSION: Calcified granuloma left lower lobe. No edema or airspace opacity. Cardiac silhouette within normal limits. Electronically Signed   By: Bretta Bang III M.D.   On: 03/16/2021 13:04    EKG:  Independently reviewed. Afib, RBBB  Assessment/Plan Active Problems:   A-fib (HCC)  (please populate well all problems here in Problem List. (For example, if patient is on BP meds at home and you resume or decide to hold them, it is a problem that needs to be her. Same for CAD, COPD, HLD and so on)  New onset of a-fib/a flutter with RVR -On cardizem drip -CHADS2=1, cardiology recommend heparin drip for now, probably in case patient may need cardiovert? -Check lipid panel  Hematuria -Likely from traumatic Foley insertion, resolved yesterday.  Thyroid mass vs paratracheal mass -Denies any symptoms of hoarseness, no swallowing problems, recommend outpatient ultrasound and general surgery for fine-needle biopsy.  Explained plan to patient and his wife at bedside, both showed understanding and agreed.  DVT prophylaxis: Heparin drip Code Status: Full Code Family Communication: Wife at bedside Disposition Plan: Expect less than 2 midnight hospital stay. Consults called: Cardiology Admission status: PCU   Emeline General MD [facility] Pager [phone]  03/16/2021, 4:24 PM

## 2021-03-16 NOTE — ED Triage Notes (Signed)
Patient presents to the ED after being evaluated at his doctors office.  Patients doctors office called and said that the patient was showing SVT on the monitor. Patient has no complaints at this time.

## 2021-03-16 NOTE — ED Provider Notes (Addendum)
[facility] EMERGENCY DEPARTMENT Provider Note   CSN: [id number] Arrival date & time: 03/16/21  1154     History Chief Complaint  Patient presents with  . Abnormal ECG    Jermaine Wilson is a 70 y.o. male.  70 year old male presents from his physician's office due to increased heart rate.  Patient was therefore constipation and concern for possible abdominal wall hernia.  He was asymptomatic with respect to chest pain, shortness of breath.  Heart rate was taken at that time and was found to be elevated.  He has no prior history of cardiac arrhythmia.  States he did have hip surgery 3 weeks ago and denies any exertional dyspnea.  He otherwise feels at his baseline.  Has not had any fever, vomiting or loose stools.  Was sent to for further evaluation        Past Medical History:  Diagnosis Date  . Smoker     There are no problems to display for this patient.   History reviewed. No pertinent surgical history.     No family history on file.     Home Medications Prior to Admission medications   Medication Sig Start Date End Date Taking? Authorizing Provider  cholecalciferol (VITAMIN D) 25 MCG (1000 UT) tablet Take 1,000 Units by mouth daily.    [provider]  diclofenac (VOLTAREN) 50 MG EC tablet Take 50 mg by mouth 2 (two) times daily.  10/31/18   [provider]  Multiple Vitamins-Minerals (MULTIVITAMIN WITH MINERALS) tablet Take 1 tablet by mouth daily.    [provider]  Omega-3 Fatty Acids (FISH OIL) 1000 MG CAPS Take 1,000 mg by mouth daily. Patient not taking: Reported on 01/15/2021    [provider]    Allergies    Patient has no known allergies.  Review of Systems   Review of Systems  All other systems reviewed and are negative.   Physical Exam Updated Vital Signs Ht 1.753 m (5\' 9" )   Wt 76.7 kg   BMI 24.96 kg/m   Physical Exam Vitals and nursing note reviewed.  Constitutional:      General:  He is not in acute distress.    Appearance: Normal appearance. He is well-developed. He is not toxic-appearing.  HENT:     Head: Normocephalic and atraumatic.  Eyes:     General: Lids are normal.     Conjunctiva/sclera: Conjunctivae normal.     Pupils: Pupils are equal, round, and reactive to light.  Neck:     Thyroid: No thyroid mass.     Trachea: No tracheal deviation.  Cardiovascular:     Rate and Rhythm: Regular rhythm. Tachycardia present.     Heart sounds: Normal heart sounds. No murmur heard. No gallop.   Pulmonary:     Effort: Pulmonary effort is normal. No respiratory distress.     Breath sounds: Normal breath sounds. No stridor. No decreased breath sounds, wheezing, rhonchi or rales.  Abdominal:     General: Bowel sounds are normal. There is no distension.     Palpations: Abdomen is soft.     Tenderness: There is no abdominal tenderness. There is no rebound.  Musculoskeletal:        General: No tenderness. Normal range of motion.     Cervical back: Normal range of motion and neck supple.  Skin:    General: Skin is warm and dry.     Findings: No abrasion or rash.  Neurological:  Mental Status: He is alert and oriented to person, place, and time.     GCS: GCS eye subscore is 4. GCS verbal subscore is 5. GCS motor subscore is 6.     Cranial Nerves: No cranial nerve deficit.     Sensory: No sensory deficit.  Psychiatric:        Speech: Speech normal.        Behavior: Behavior normal.     ED Results / Procedures / Treatments   Labs (all labs ordered are listed, but only abnormal results are displayed) Labs Reviewed  RESP PANEL BY RT-PCR (FLU A&B, COVID) [id number]  CBC WITH DIFFERENTIAL/PLATELET  COMPREHENSIVE METABOLIC PANEL  TSH    EKG EKG Interpretation  Date/Time:  Monday Mar 16 2021 12:22:07 EDT Ventricular Rate:  156 PR Interval:  59 QRS Duration: 129 QT Interval:  362 QTC Calculation: 584 R Axis:   85 Text Interpretation: Wide-QRS tachycardia  Right bundle branch block Confirmed by [name] ([id number]) on 03/16/2021 12:25:25 PM   Radiology No results found.  Procedures Procedures   Medications Ordered in ED Medications  0.9 %  sodium chloride infusion (has no administration in time range)  metoprolol tartrate (LOPRESSOR) injection 5 mg (has no administration in time range)    ED Course  I have reviewed the triage vital signs and the nursing notes.  Pertinent labs & imaging results that were available during my care of the patient were reviewed by me and considered in my medical decision making (see chart for details).    MDM Rules/Calculators/A&P                         Chest x-ray without acute findings.  COVID test negative.  TSH within normal limits. Pt given lopressor 5 mg IV push for patient's tachyarrhythmia which appears to be possible a flutter versus sinus tach.  Also concern for possible PE and patient had a chest CT. chest CT showed a paratracheal mass.  Repeat EKG shows a flutter with 2-1 block.  Unsure of when patient symptoms actually began.  Rate has improved.  Discussed with cardiologist on-call and he recommends heparin and Cardizem drip.  He will see the patient in consultation.  Will require admission for this.  CRITICAL CARE Performed by: Toy Baker Total critical care time: 60 minutes Critical care time was exclusive of separately billable procedures and treating other patients. Critical care was necessary to treat or prevent imminent or life-threatening deterioration. Critical care was time spent personally by me on the following activities: development of treatment plan with patient and/or surrogate as well as nursing, discussions with consultants, evaluation of patient's response to treatment, examination of patient, obtaining history from patient or surrogate, ordering and performing treatments and interventions, ordering and review of laboratory studies, ordering and review of radiographic  studies, pulse oximetry and re-evaluation of patient's condition.  Final Clinical Impression(s) / ED Diagnoses Final diagnoses:  None    Rx / DC Orders ED Discharge Orders    None       [name], MD 03/16/21 1531    [name], MD 03/16/21 [phone]

## 2021-03-17 ENCOUNTER — Other Ambulatory Visit (HOSPITAL_COMMUNITY): Payer: Self-pay

## 2021-03-17 ENCOUNTER — Telehealth: Payer: Self-pay

## 2021-03-17 DIAGNOSIS — I4891 Unspecified atrial fibrillation: Secondary | ICD-10-CM | POA: Diagnosis not present

## 2021-03-17 LAB — HEPARIN LEVEL (UNFRACTIONATED)
Heparin Unfractionated: 0.14 IU/mL — ABNORMAL LOW (ref 0.30–0.70)
Heparin Unfractionated: 0.35 IU/mL (ref 0.30–0.70)

## 2021-03-17 LAB — CBC
HCT: 39.4 % (ref 39.0–52.0)
Hemoglobin: 12.8 g/dL — ABNORMAL LOW (ref 13.0–17.0)
MCH: 30.5 pg (ref 26.0–34.0)
MCHC: 32.5 g/dL (ref 30.0–36.0)
MCV: 93.8 fL (ref 80.0–100.0)
Platelets: 367 10*3/uL (ref 150–400)
RBC: 4.2 MIL/uL — ABNORMAL LOW (ref 4.22–5.81)
RDW: 13.8 % (ref 11.5–15.5)
WBC: 10.1 10*3/uL (ref 4.0–10.5)
nRBC: 0 % (ref 0.0–0.2)

## 2021-03-17 MED ORDER — APIXABAN 5 MG PO TABS: 5.0000 mg | ORAL_TABLET | Freq: Two times a day (BID) | ORAL | 1 refills | Status: DC

## 2021-03-17 MED ORDER — METOPROLOL SUCCINATE ER 50 MG PO TB24
50.0000 mg | ORAL_TABLET | Freq: Every day | ORAL | 1 refills | Status: DC
  Filled 2021-03-17: qty 30, 30d supply, fill #0

## 2021-03-17 MED ORDER — DILTIAZEM HCL ER COATED BEADS 240 MG PO CP24
240.0000 mg | ORAL_CAPSULE | Freq: Every day | ORAL | 1 refills | Status: DC
Start: 2021-03-17 — End: 2021-03-17

## 2021-03-17 MED ORDER — DILTIAZEM HCL ER COATED BEADS 240 MG PO CP24
240.0000 mg | ORAL_CAPSULE | Freq: Every day | ORAL | 1 refills | Status: DC
  Filled 2021-03-17: qty 30, 30d supply, fill #0

## 2021-03-17 MED ORDER — METOPROLOL SUCCINATE ER 50 MG PO TB24: 50.0000 mg | ORAL_TABLET | Freq: Every day | ORAL | 1 refills | Status: DC

## 2021-03-17 MED ORDER — APIXABAN 5 MG PO TABS
5.0000 mg | ORAL_TABLET | Freq: Two times a day (BID) | ORAL | Status: DC
  Administered 2021-03-17: 5 mg via ORAL
  Filled 2021-03-17: qty 1

## 2021-03-17 MED ORDER — METOPROLOL SUCCINATE ER 50 MG PO TB24
50.0000 mg | ORAL_TABLET | Freq: Every day | ORAL | Status: DC
  Administered 2021-03-17: 50 mg via ORAL
  Filled 2021-03-17: qty 1

## 2021-03-17 MED ORDER — HEPARIN BOLUS VIA INFUSION
2000.0000 [IU] | Freq: Once | INTRAVENOUS | Status: AC
  Administered 2021-03-17: 2000 [IU] via INTRAVENOUS
  Filled 2021-03-17: qty 2000

## 2021-03-17 MED ORDER — APIXABAN 5 MG PO TABS
5.0000 mg | ORAL_TABLET | Freq: Two times a day (BID) | ORAL | 1 refills | Status: DC
  Filled 2021-03-17: qty 60, 30d supply, fill #0

## 2021-03-17 MED ORDER — DILTIAZEM HCL ER COATED BEADS 240 MG PO CP24
240.0000 mg | ORAL_CAPSULE | Freq: Every day | ORAL | Status: DC
  Administered 2021-03-17: 240 mg via ORAL
  Filled 2021-03-17: qty 1

## 2021-03-17 NOTE — Progress Notes (Signed)
ANTICOAGULATION CONSULT NOTE - Follow Up Consult  Pharmacy Consult for heparin Indication: Afib/flutter  Labs: Recent Labs    03/16/21 1227 03/17/21 0103  HGB 14.1 12.8*  HCT 44.3 39.4  PLT 451* 367  HEPARINUNFRC  --  0.14*  CREATININE 1.00  --     Assessment: 69yo male subtherapeutic on heparin with initial dosing for Afib/flutter w/ RVR; no gtt issues or signs of bleeding per RN.  Goal of Therapy:  Heparin level 0.3-0.7 units/ml   Plan:  Will rebolus with heparin 2000 units and increase heparin gtt by 2-3 units/kg/hr to 1300 units/hr and check level in 6 hours.    Vernard Gambles, PharmD, BCPS  03/17/2021,1:48 AM

## 2021-03-17 NOTE — TOC Benefit Eligibility Note (Signed)
Patient Advocate Encounter  Insurance verification completed.    The patient is currently admitted and upon discharge could be taking Eliquis 5 mg.  The current 30 day co-pay is, $312.00 due to a $265.00 deductible.  After deductible is met the co-pay will be $47.00 a month.   The patient is currently admitted and upon discharge could be taking Xarelto 20 mg.  The current 30 day co-pay is, $312.00 due to a $265.00 deductible.  After deductible is met the co-pay will be $47.00 a month.   The patient is insured through [employer] Part D    Jeffrey Smith, CPhT Pharmacy Patient Advocate Specialist [place] [facility] Team Direct Number: [phone]  Fax: [phone]        

## 2021-03-17 NOTE — Progress Notes (Signed)
ANTICOAGULATION CONSULT NOTE  Pharmacy Consult for heparin Indication: atrial flutter vs. VTE  No Known Allergies  Patient Measurements: Height: 5\' 9"  (175.3 cm) Weight: 76.7 kg (169 lb) IBW/kg (Calculated) : 70.7 Heparin Dosing Weight: 76.7 kg  Vital Signs: Temp: 98.2 F (36.8 C) (05/03 0734) Temp Source: Oral (05/03 0734) BP: 118/68 (05/03 0734) Pulse Rate: 75 (05/03 0734)  Labs: Recent Labs    03/16/21 1227 03/17/21 0103 03/17/21 0755  HGB 14.1 12.8*  --   HCT 44.3 39.4  --   PLT 451* 367  --   HEPARINUNFRC  --  0.14* 0.35  CREATININE 1.00  --   --     Estimated Creatinine Clearance: 67.8 mL/min (by C-G formula based on SCr of 1 mg/dL).   Medical History: Past Medical History:  Diagnosis Date  . Smoker     Assessment: 82 yom with new onset afib (CHADSVASC= 1) and on IV heparin. Oral anticoagulation recommended by cardiology -Heparin level at goal -hg 14.1>>12.8 (fluid positive)  Goal of Therapy:  Heparin level 0.3-0.7 units/ml Monitor platelets by anticoagulation protocol: Yes   Plan:  -Continue heparin at 1300 units/hr -Recheck heparin level today -Daily heparin level and CBC  62, PharmD Clinical Pharmacist **Pharmacist phone directory can now be found on [email or web address] ([email or web address]).  Listed under [facility].

## 2021-03-17 NOTE — Telephone Encounter (Signed)
Location of hospitalization: [facility] Reason for hospitalization: HR was 160 during a FU visit with PCP Date of discharge: 03/17/2021 Date of first communication with patient: today Person contacting patient: Erby Pian, CMA Current symptoms: none Do you understand why you were in the Hospital: Yes Questions regarding discharge instructions: None Where were you discharged to: Home Medications reviewed: Yes Allergies reviewed: Yes Dietary changes reviewed: Yes. Discussed low fat and low salt diet.  Referals reviewed: NA Activities of Daily Living: Able to with mild limitations Any transportation issues/concerns: None Any patient concerns: None Confirmed importance & date/time of Follow up appt: Yes Confirmed with patient if condition begins to worsen call. Pt was given the office number and encouraged to call back with questions or concerns: Yes

## 2021-03-17 NOTE — Discharge Summary (Signed)
Physician Discharge Summary  Jermaine Wilson MVE:[id number] DOB: Apr 01, 1951 DOA: 03/16/2021  PCP: Eartha Inch, MD  Admit date: 03/16/2021 Discharge date: 03/17/2021  Time spent: 12 minutes  Recommendations for Outpatient Follow-up:  1. Requires outpatient work-up re: Exophytic thyroid mass 2. See new meds below 3. Requires outpatient repeat labs in about 1 week CBC, Chem-12  Discharge Diagnoses:  MAIN problem for hospitalization   New onset A. 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  Cardiology consulted--patient remained hemodynamically stable Option of cardioversion as an outpatient Converted to oral metoprolol Cardizem on 5/3 Started Eliquis for anticoagulation and discontinued various NSAIDs  Stabilized for discharge  BN/creatinine 10/1.0-->11/1.0 LDL 172 WBC 12.3--->10.1 Hemoglobin 14.1-12.8   Discharge Exam: Vitals:   03/17/21 0353 03/17/21 0734  BP: 130/79 118/68  Pulse: 74 75  Resp: 17 14  Temp: 98.1 F (36.7 C) 98.2 F (36.8 C)  SpO2: 95% 93%    Subj on day of d/c   Awake no distress no chest pain  General Exam on discharge  EOMI NCAT no focal deficit no icterus no pallor Chest clear no rales rhonchi No lower extremity edema Abdomen soft no rebound no guarding ROM intact no focal deficit  Discharge Instructions   Discharge Instructions    Amb referral to AFIB Clinic   Complete by: As directed    Diet - low sodium heart healthy   Complete by: As directed    Discharge instructions    Complete by: As directed    Please follow instructions as per Dr. Rosemary Holms Please follow-up with your primary care physician for further follow-up of your thyroid mass   Increase activity slowly   Complete by: As directed    Increase activity slowly   Complete by: As directed      Allergies as of 03/17/2021   No Known Allergies     Medication List    STOP taking these medications   acetaminophen 500 MG tablet Commonly known as: TYLENOL   aspirin 325 MG tablet   diclofenac 50 MG EC tablet Commonly known as: VOLTAREN     TAKE these medications   Cartia XT 240 MG 24 hr capsule Generic drug: diltiazem Take 1 capsule (240 mg total) by mouth daily.   cholecalciferol 25 MCG (1000 UNIT) tablet Commonly known as: VITAMIN D Take 1,000 Units by mouth daily.   diphenhydramine-acetaminophen 25-500 MG Tabs tablet Commonly known as: TYLENOL PM Take 1 tablet by mouth at bedtime as needed (pain).   Eliquis 5 MG Tabs tablet Generic drug: apixaban Take 1 tablet (5 mg total) by mouth 2 (two) times daily.   Fish Oil 1000 MG Caps Take 1,000 mg by mouth daily.   metoprolol succinate 50 MG 24 hr tablet Commonly known as: TOPROL-XL Take 1 tablet (50 mg total) by mouth daily. Take with or immediately following a meal.   multivitamin with minerals tablet Take 1 tablet by mouth daily.   tiZANidine 4 MG tablet Commonly known as: ZANAFLEX Take 4 mg by mouth 2 (two) times  daily.      No Known Allergies  Follow-up Information    Elder Negus, MD Follow up on 03/24/2021.   Specialties: Cardiology, Radiology Why: 3:30 PM Contact information: [address] [place] [address] [phone]                The results of significant diagnostics from this hospitalization (including imaging, microbiology, ancillary and laboratory) are listed below for reference.    Significant Diagnostic Studies: CT Angio Chest PE W/Cm &/Or Wo Cm  Result Date:  03/16/2021 CLINICAL DATA:  Short of breath. Tachycardia rule out pulmonary embolism. EXAM: CT ANGIOGRAPHY CHEST WITH CONTRAST TECHNIQUE: Multidetector CT imaging of the chest was performed using the standard protocol during bolus administration of intravenous contrast. Multiplanar CT image reconstructions and MIPs were obtained to evaluate the vascular anatomy. CONTRAST:  27mL OMNIPAQUE IOHEXOL 350 MG/ML SOLN COMPARISON:  Chest x-ray 03/16/2021 FINDINGS: Cardiovascular: Satisfactory enhancement of pulmonary arteries. Negative for pulmonary embolism. No significant enhancement of the thoracic aorta. There is mild atherosclerotic disease in the aorta without aneurysm. There is atherosclerotic calcification in the left coronary artery. No pericardial effusion. 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 This is low density centrally and may be attached to the posterior left thyroid. No other mediastinal mass or adenopathy identified. Lungs/Pleura: Calcified granuloma left lower lobe. No acute infiltrate or effusion. Negative for mass lesion. Upper Abdomen: Negative Musculoskeletal: Thoracic disc degeneration and anterior spurring. No acute skeletal abnormality. Review of the MIP images confirms the above findings. IMPRESSION: Negative for pulmonary embolism. Atherosclerotic calcification aortic arch and coronary arteries. 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 Recommend thyroid ultrasound. (Ref: J Am Coll Radiol. 2015 Feb;12(2): 143-50). Electronically Signed   By: Marlan Palau M.D.   On: 03/16/2021 15:38   DG Chest Port 1 View  Result Date: 03/16/2021 CLINICAL DATA:  Shortness of breath EXAM: PORTABLE CHEST 1 VIEW COMPARISON:  Apr 01, 2019 FINDINGS: There is a calcified granuloma in the left lower lobe. The lungs elsewhere are clear. Heart size and pulmonary vascularity are normal. No  adenopathy. There is degenerative change in the thoracic spine. IMPRESSION: Calcified granuloma left lower lobe. No edema or airspace opacity. Cardiac silhouette within normal limits. Electronically Signed   By: Bretta Bang III M.D.   On: 03/16/2021 13:04    Microbiology: Recent Results (from the past 240 hour(s))  Resp Panel by RT-PCR (Flu A&B, Covid) Nasopharyngeal Swab     Status: None   Collection Time: 03/16/21 11:54 AM   Specimen: Nasopharyngeal Swab; Nasopharyngeal(NP) swabs in vial transport medium  Result Value Ref Range Status   SARS Coronavirus 2 by RT PCR NEGATIVE NEGATIVE Final    Comment: (NOTE) SARS-CoV-2 target nucleic acids are NOT DETECTED.  The SARS-CoV-2 RNA is generally detectable in upper respiratory specimens during the acute phase of infection. The lowest concentration of SARS-CoV-2 viral copies this assay can detect is 138 copies/mL. A negative result does not preclude SARS-Cov-2 infection and should not be used as the sole basis for treatment or other patient management decisions. A negative result may occur with  improper specimen collection/handling, submission of specimen other than nasopharyngeal swab, presence of viral mutation(s) within the areas targeted by this assay, and inadequate number of viral copies(<138 copies/mL). A negative result must be combined with clinical observations, patient history, and epidemiological information. The expected result is Negative.  Fact Sheet for Patients:  BloggerCourse.com  Fact Sheet for Healthcare Providers:  SeriousBroker.it  This test is no t yet approved or cleared by the [place] FDA and  has been authorized for detection and/or diagnosis of SARS-CoV-2 by FDA under an Emergency Use Authorization (EUA). This EUA will remain  in effect (meaning this test can be used) for the duration of the COVID-19 declaration under Section 564(b)(1) of the Act,  21 U.S.C.section 360bbb-3(b)(1), unless the authorization is terminated  or revoked sooner.       Influenza A by PCR NEGATIVE NEGATIVE Final   Influenza B by PCR NEGATIVE NEGATIVE Final    Comment: (NOTE) The Xpert Xpress SARS-CoV-2/FLU/RSV plus assay is intended as an aid in the diagnosis of influenza from Nasopharyngeal swab specimens and should not be used as a sole basis for treatment. Nasal washings and aspirates are unacceptable for Xpert Xpress SARS-CoV-2/FLU/RSV testing.  Fact Sheet for Patients: BloggerCourse.com  Fact Sheet for Healthcare Providers: SeriousBroker.it  This test is not yet approved or cleared by the [place] FDA and has been authorized for detection and/or diagnosis of SARS-CoV-2 by FDA under an Emergency Use Authorization (EUA). This EUA will remain in effect (meaning this test can be used) for the duration of the COVID-19 declaration under Section 564(b)(1) of the Act, 21 U.S.C. section 360bbb-3(b)(1), unless the authorization is terminated or revoked.  Performed at [facility], [address] [place], [place] [address]      Labs: Basic Metabolic Panel: Recent Labs  Lab 03/16/21 1227  NA 138  K 4.1  CL 102  CO2 26  GLUCOSE 115*  BUN 11  CREATININE 1.00  CALCIUM 9.7   Liver Function Tests: Recent Labs  Lab 03/16/21 1227  AST 20  ALT 21  ALKPHOS 96  BILITOT 1.0  PROT 6.9  ALBUMIN 3.8   No results for input(s): LIPASE, AMYLASE in the last 168 hours. No results for input(s): AMMONIA in the last 168 hours. CBC: Recent Labs  Lab 03/16/21 1227 03/17/21 0103  WBC 12.3* 10.1  NEUTROABS 9.0*  --   HGB 14.1 12.8*  HCT 44.3 39.4  MCV 95.9 93.8  PLT 451* 367   Cardiac Enzymes: No results for input(s): CKTOTAL, CKMB, CKMBINDEX, TROPONINI in the last 168 hours. BNP: BNP (last 3 results) No results for input(s): BNP in the last 8760 hours.  ProBNP (last 3  results) No results for input(s): PROBNP in the last 8760 hours.  CBG: No results for input(s): GLUCAP in the last 168 hours.     Signed:  Rhetta Mura MD   [facility] 03/17/2021, 1:32 PM

## 2021-03-17 NOTE — Hospital Course (Signed)
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 Rosemary Holms  BN/creatinine 10/1.0-->11/1.0 LDL 172 WBC 12.3--->10.1 Hemoglobin 14.1-12.8

## 2021-03-17 NOTE — Progress Notes (Signed)
ANTICOAGULATION CONSULT NOTE  Pharmacy Consult for heparin > Eliquis Indication: atrial flutter vs. VTE  No Known Allergies  Patient Measurements: Height: 5\' 9"  (175.3 cm) Weight: 76.7 kg (169 lb) IBW/kg (Calculated) : 70.7 Heparin Dosing Weight: 76.7 kg  Vital Signs: Temp: 98.2 F (36.8 C) (05/03 0734) Temp Source: Oral (05/03 0734) BP: 118/68 (05/03 0734) Pulse Rate: 75 (05/03 0734)  Labs: Recent Labs    03/16/21 1227 03/17/21 0103 03/17/21 0755  HGB 14.1 12.8*  --   HCT 44.3 39.4  --   PLT 451* 367  --   HEPARINUNFRC  --  0.14* 0.35  CREATININE 1.00  --   --     Estimated Creatinine Clearance: 67.8 mL/min (by C-G formula based on SCr of 1 mg/dL).   Medical History: Past Medical History:  Diagnosis Date  . Smoker     Assessment: 52 yom with new onset afib (CHADSVASC= 1) and on IV heparin. Oral anticoagulation recommended by cardiology -Heparin level at goal -hg 14.1>>12.8 (fluid positive)  Goal of Therapy:  Heparin level 0.3-0.7 units/ml Monitor platelets by anticoagulation protocol: Yes   Plan:  -Continue heparin at 1300 units/hr -Recheck heparin level today -Daily heparin level and CBC  62, PharmD Clinical Pharmacist **Pharmacist phone directory can now be found on [email or web address] ([email or web address]).  Listed under [facility].  Addendum -  Pharmacy asked to transition heparin to Eliquis for discharge. Will start Eliquis 5 mg BID.  [facility], Reece Leader, BCCP Clinical Pharmacist  03/17/2021 12:27 PM   [facility] pharmacy phone numbers are listed on [email or web address]

## 2021-03-17 NOTE — Plan of Care (Signed)

## 2021-03-17 NOTE — TOC Progression Note (Signed)
Transition of Care [facility]) - Progression Note    Patient Details  Name: Jermaine Wilson MRN: [id number] Date of Birth: 21-Jan-1951  Transition of Care [facility] CM/SW Contact  Leone Haven, RN Phone Number: 03/17/2021, 2:39 PM  Clinical Narrative:    NCM left patient message on vm, he has left the room.  Informed him of the copay amt and the deductible amt.  If he has any questions he can call.         Expected Discharge Plan and Services           Expected Discharge Date: 03/17/21                                     Social Determinants of Health (SDOH) Interventions    Readmission Risk Interventions No flowsheet data found.

## 2021-03-17 NOTE — Progress Notes (Signed)
Rate fairly well controlled. Ok to discharge on diltiazem PO 240 mg daily, metoprolol succinate 50 mg daily, eliquis 5 mg bid. Stopped Aspirin 325 and diclofenac sodium. Above meds reconciled and prescribed to [facility] pharmacy.  Outpatient f/iu arranged on 03/24/2021 with me. Will consider outpatient cardioversion after 4 weeks anticoagulation Continue thyroid workup as per primary team/PCP   Elder Negus, MD Pager: [phone] Office: [phone]

## 2021-03-18 LAB — T3, FREE: T3, Free: 2.5 pg/mL (ref 2.0–4.4)

## 2021-03-19 ENCOUNTER — Telehealth: Payer: Self-pay

## 2021-03-19 NOTE — Telephone Encounter (Signed)
Yes

## 2021-03-19 NOTE — Telephone Encounter (Signed)
Pt called because he would like to know if it is okay for him to take miralax, he stated he was constipated and was not sure if he could take it after his last hospital visit.

## 2021-03-20 ENCOUNTER — Other Ambulatory Visit (HOSPITAL_COMMUNITY): Payer: Self-pay

## 2021-03-20 NOTE — Telephone Encounter (Signed)
Attempted to call pt, no answer. Left vm requesting call back.

## 2021-03-20 NOTE — Telephone Encounter (Signed)
Patient called back. I have let him know he can take Miralax.

## 2021-03-24 ENCOUNTER — Other Ambulatory Visit: Payer: Self-pay

## 2021-03-24 ENCOUNTER — Encounter: Payer: Self-pay | Admitting: Cardiology

## 2021-03-24 ENCOUNTER — Ambulatory Visit: Payer: Medicare PPO | Admitting: Cardiology

## 2021-03-24 VITALS — BP 141/78 | HR 83 | Temp 98.0°F | Resp 16 | Ht 69.0 in | Wt 168.0 lb

## 2021-03-24 DIAGNOSIS — I4892 Unspecified atrial flutter: Secondary | ICD-10-CM | POA: Insufficient documentation

## 2021-03-24 DIAGNOSIS — E0789 Other specified disorders of thyroid: Secondary | ICD-10-CM

## 2021-03-24 DIAGNOSIS — I4819 Other persistent atrial fibrillation: Secondary | ICD-10-CM

## 2021-03-24 NOTE — Progress Notes (Signed)
Follow up visit  Subjective:   Jermaine Wilson, male    DOB: 10-14-1951, 70 y.o.   MRN: [id number]   HPI   Chief Complaint  Patient presents with  . Hospitalization Follow-up  . Atrial Fibrillation  . Pacemaker Problem     70 y.o. Caucasian male smoker ,h/o right hip replacement surgery 02/2021, new diagnosis of paroxysmal Afib/flutter (03/2021), and possible thyroid mass  Patient was started on metoprolol succinate 50 mg daily, diltiazem 240 mg daily, and eliquis 5 mg bid.  Patient is feeling well.  Denies any chest pain, palpitations, shortness of breath symptoms.  Heart rate has reduced.  He is awaiting further follow-up with Dr. Melford Aase regarding abnormal thyroid finding on CT scan.   Current Outpatient Medications on File Prior to Visit  Medication Sig Dispense Refill  . apixaban (ELIQUIS) 5 MG TABS tablet Take 1 tablet (5 mg total) by mouth 2 (two) times daily. 60 tablet 1  . cholecalciferol (VITAMIN D) 25 MCG (1000 UT) tablet Take 1,000 Units by mouth daily.    Marland Kitchen diltiazem (CARDIZEM CD) 240 MG 24 hr capsule Take 1 capsule (240 mg total) by mouth daily. 30 capsule 1  . diphenhydramine-acetaminophen (TYLENOL PM) 25-500 MG TABS tablet Take 1 tablet by mouth at bedtime as needed (pain).    . metoprolol succinate (TOPROL-XL) 50 MG 24 hr tablet Take 1 tablet (50 mg total) by mouth daily. Take with or immediately following a meal. 30 tablet 1  . Multiple Vitamins-Minerals (MULTIVITAMIN WITH MINERALS) tablet Take 1 tablet by mouth daily.    Marland Kitchen tiZANidine (ZANAFLEX) 4 MG tablet Take 4 mg by mouth 2 (two) times daily.     No current facility-administered medications on file prior to visit.    Cardiovascular & other pertient studies:  EKG 03/24/2021: Atrial fibrillation 81 bpm  Right bundle branch block  EKG 03/16/2021: A. fib with RVR 156 bpm Right bundle branch block  Repeat EKG 03/16/2021: Possible atypical atrial flutter with variable conduction Ventricular rate 124  bpm Right bundle branch block  CTA Chest 03/16/2021: Negative for pulmonary embolism. Atherosclerotic calcification aortic arch and coronary arteries. 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 Recommend thyroid ultrasound. (Ref: J Am Coll Radiol. 2015 Feb;12(2): 143-50).   Recent labs: 03/16/2021: Glucose 115, BUN/Cr 11/1.0. EGFR >60. Na/K 138/4.1. Rest of the CMP normal H/H 12.8/39.4. MCV 93.8. Platelets 367 HbA1C M/A Chol 235, TG 115, HDL 40, LDL 172 TSH 0.5 normal, free T4 1.18 (mildly elevated)    Review of Systems  Cardiovascular: Negative for chest pain, dyspnea on exertion, leg swelling, palpitations and syncope.         Vitals:   03/24/21 1517  BP: (!) 141/78  Pulse: 83  Resp: 16  Temp: 98 F (36.7 C)  SpO2: 96%    Body mass index is 24.81 kg/m. Filed Weights   03/24/21 1517  Weight: 168 lb (76.2 kg)     Objective:   Physical Exam Vitals and nursing note reviewed.  Constitutional:      General: He is not in acute distress. Neck:     Vascular: No JVD.  Cardiovascular:     Rate and Rhythm: Normal rate. Rhythm irregular.     Heart sounds: Normal heart sounds. No murmur heard.   Pulmonary:     Effort: Pulmonary effort is normal.     Breath sounds: Normal breath sounds. No wheezing or rales.  Musculoskeletal:     Right lower leg: No edema.     Left lower leg: No edema.           Assessment & Recommendations:   70 y.o. Caucasian male smoker ,h/o right hip replacement surgery 02/2021, new diagnosis of paroxysmal Afib/flutter (03/2021), and possible thyroid mass  A. fib with RVR: Precipitating etiology possibly hyperthyroidism Rate well controlled on metoprolol succinate 50 mg daily, diltiazem 240 mg daily. Recommend follow-up with Dr. Melford Aase regarding thyroid abnormality noted on CT scan. CHA2DS2-VASc score 1, annual stroke risk 0.6%.  As such, long-term  anticoagulation is not required.  However, given possibility of rhythm control therapies, including cardioversion in the near future, recommend anticoagulation for now.  Continue Eliquis and milligrams daily. Recommend echocardiogram. Follow-up in 3 weeks.  We will readdress discussion regarding cardioversion rate uncontrolled, or patient is symptomatic.     Jermaine Mormon, MD Pager: [phone] Office: [phone]

## 2021-03-31 ENCOUNTER — Other Ambulatory Visit: Payer: Self-pay

## 2021-03-31 DIAGNOSIS — I4892 Unspecified atrial flutter: Secondary | ICD-10-CM

## 2021-04-01 ENCOUNTER — Ambulatory Visit: Payer: Medicare PPO

## 2021-04-01 ENCOUNTER — Other Ambulatory Visit: Payer: Self-pay

## 2021-04-01 DIAGNOSIS — I4892 Unspecified atrial flutter: Secondary | ICD-10-CM

## 2021-04-22 ENCOUNTER — Other Ambulatory Visit: Payer: Self-pay

## 2021-04-22 ENCOUNTER — Ambulatory Visit: Payer: Medicare PPO | Admitting: Cardiology

## 2021-04-22 ENCOUNTER — Encounter: Payer: Self-pay | Admitting: Cardiology

## 2021-04-22 VITALS — BP 118/71 | HR 108 | Temp 98.4°F | Resp 17 | Ht 69.0 in | Wt 174.0 lb

## 2021-04-22 DIAGNOSIS — I4819 Other persistent atrial fibrillation: Secondary | ICD-10-CM

## 2021-04-22 DIAGNOSIS — E0789 Other specified disorders of thyroid: Secondary | ICD-10-CM

## 2021-04-22 MED ORDER — DILTIAZEM HCL ER COATED BEADS 240 MG PO CP24: 240.0000 mg | ORAL_CAPSULE | Freq: Every day | ORAL | 3 refills | Status: DC

## 2021-04-22 MED ORDER — METOPROLOL SUCCINATE ER 50 MG PO TB24: 50.0000 mg | ORAL_TABLET | Freq: Every day | ORAL | 3 refills | Status: DC

## 2021-04-22 NOTE — Progress Notes (Signed)
Follow up visit  Subjective:   Jermaine Wilson, male    DOB: 07-Jun-1951, 70 y.o.   MRN: [id number]   HPI   Chief Complaint  Patient presents with  . Persistent atrial fibrillation  . Follow-up    3-4 week     70 y.o. Caucasian male smoker, persistent A. fib, multinodular goiter  Patient was diagnosed with A. fib in 02/2021 after his hip surgery.  He was also found to have a thyroid nodule for which she recently saw her endocrinologist Dr. Jane Canary with [facility].  For his A. fib, patient was started on metoprolol succinate 50 mg daily, diltiazem 240 mg daily, and eliquis 5 mg bid.  Patient tells me that he ran out of all his medications 5 days ago, including metoprolol, diltiazem, and Eliquis.  He is now taking aspirin 81 mg daily.Marland Kitchen  He denies any chest pain, but has exertional dyspnea symptoms after climbing up stairs.  Reviewed echocardiogram results with the patient, details below. Regarding his multinodular goiter, he was recommended FNA biopsy.  Current Outpatient Medications on File Prior to Visit  Medication Sig Dispense Refill  . apixaban (ELIQUIS) 5 MG TABS tablet Take 1 tablet (5 mg total) by mouth 2 (two) times daily. 60 tablet 1  . cholecalciferol (VITAMIN D) 25 MCG (1000 UT) tablet Take 1,000 Units by mouth daily.    Marland Kitchen diltiazem (CARDIZEM CD) 240 MG 24 hr capsule Take 1 capsule (240 mg total) by mouth daily. 30 capsule 1  . diphenhydramine-acetaminophen (TYLENOL PM) 25-500 MG TABS tablet Take 1 tablet by mouth at bedtime as needed (pain).    . metoprolol succinate (TOPROL-XL) 50 MG 24 hr tablet Take 1 tablet (50 mg total) by mouth daily. Take with or immediately following a meal. 30 tablet 1  . Multiple Vitamins-Minerals (MULTIVITAMIN WITH MINERALS) tablet Take 1 tablet by mouth daily.    Marland Kitchen tiZANidine (ZANAFLEX) 4 MG tablet Take 4 mg by mouth 2 (two) times daily.     No current facility-administered medications on file prior to visit.    Cardiovascular &  other pertient studies:  Echocardiogram 04/01/2021:  Mildly depressed LV systolic function with visual EF 40-45%. Left  ventricle cavity is normal in size. Unable to evaluate diastolic function  due to atrial fibrillation. Normal LAP. Left ventricle regional wall  motion findings: Basal anteroseptal, Basal inferoseptal, Mid inferior and  Apical inferior hypokinesis. Basal inferolateral and Basal inferior  akinesis.  Left atrial cavity is mildly dilated.  Right atrial cavity is mildly dilated.  Mild (Grade I) aortic regurgitation.  Mild (Grade I) mitral regurgitation.  Mild tricuspid regurgitation.  Insignificant pericardial effusion. There is no hemodynamic significance.  No prior study for comparison.  EKG 03/24/2021: Atrial fibrillation 81 bpm  Right bundle branch block  EKG 03/16/2021: A. fib with RVR 156 bpm Right bundle branch block  Repeat EKG 03/16/2021: Possible atypical atrial flutter with variable conduction Ventricular rate 124 bpm Right bundle branch block  CTA Chest 03/16/2021: Negative for pulmonary embolism. Atherosclerotic calcification aortic arch and coronary arteries. 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 Recommend thyroid ultrasound. (Ref: J Am Coll Radiol. 2015 Feb;12(2): 143-50).   Recent labs: 03/16/2021: Glucose 115, BUN/Cr 11/1.0. EGFR >60. Na/K 138/4.1. Rest of the CMP normal H/H 12.8/39.4. MCV 93.8. Platelets 367 HbA1C M/A Chol 235, TG 115, HDL 40, LDL 172 TSH 0.5 normal, free T4 1.18 (mildly elevated)  Review of Systems  Cardiovascular: Positive for dyspnea on exertion. Negative for chest pain, leg swelling, palpitations and syncope.         Vitals:   04/22/21 1416  BP: 118/71  Pulse: (!) 108  Resp: 17  Temp: 98.4 F (36.9 C)  SpO2: 97%     Body mass index is 25.7 kg/m. Filed Weights   04/22/21 1416  Weight: 174 lb (78.9 kg)      Objective:   Physical Exam Vitals and nursing note reviewed.  Constitutional:      General: He is not in acute distress. Neck:     Vascular: No JVD.  Cardiovascular:     Rate and Rhythm: Tachycardia present. Rhythm irregular.     Heart sounds: Normal heart sounds. No murmur heard.   Pulmonary:     Effort: Pulmonary effort is normal.     Breath sounds: Normal breath sounds. No wheezing or rales.  Musculoskeletal:     Right lower leg: No edema.     Left lower leg: No edema.           Assessment & Recommendations:   70 y.o. Caucasian male smoker, persistent A. fib, multinodular goiter  Persistent A. Fib: Elevated ventricular rate today off metoprolol and diltiazem. Resume metoprolol succinate 50 mg daily, diltiazem 240 mg daily. CHA2DS2-VASc score 1, annual stroke risk 0.6%.  As such, long-term anticoagulation is not required.  He has ran out of his Eliquis for past 5 days.  He is not happy with Eliquis cost.  Also, with possibility of upcoming FNA biopsy, it is reasonable to hold off anticoagulation at this time. He is aware that he is unlikely to be able to undergo clinically necessary.    In absence of Eliquis, and certain wall motion abnormalities noted on echocardiogram, reasonable to take aspirin 81 mg daily. I will obtain nuclear stress test to evaluate for ischemia, especially prior to undergoing thyroid biopsy in the near future.  F/u in 4 weeks    Nigel Mormon, MD Pager: [phone] Office: [phone]

## 2021-05-13 ENCOUNTER — Other Ambulatory Visit: Payer: Medicare PPO

## 2021-05-21 ENCOUNTER — Ambulatory Visit: Payer: Medicare PPO | Admitting: Cardiology

## 2021-12-10 ENCOUNTER — Encounter: Payer: Self-pay | Admitting: Podiatry

## 2021-12-10 ENCOUNTER — Ambulatory Visit: Payer: Medicare PPO | Admitting: Podiatry

## 2021-12-10 ENCOUNTER — Other Ambulatory Visit: Payer: Self-pay

## 2021-12-10 DIAGNOSIS — B351 Tinea unguium: Secondary | ICD-10-CM | POA: Diagnosis not present

## 2021-12-10 DIAGNOSIS — M79675 Pain in left toe(s): Secondary | ICD-10-CM

## 2021-12-10 DIAGNOSIS — M79674 Pain in right toe(s): Secondary | ICD-10-CM | POA: Diagnosis not present

## 2021-12-10 DIAGNOSIS — L600 Ingrowing nail: Secondary | ICD-10-CM

## 2021-12-10 NOTE — Patient Instructions (Signed)

## 2021-12-13 NOTE — Progress Notes (Signed)
Subjective:   Patient ID: Jermaine Wilson, male   DOB: 71 y.o.   MRN: [id number]   HPI Patient presents stating he feels like he has a spicule of his left big toenail which is bothersome and all his nails are thickened and he cannot cut them himself.  States that they do get sore   ROS      Objective:  Physical Exam  Neurovascular status intact negative Bevelyn Buckles' sign noted with patient's left hallux showing a small spicule on the medial border that is irritative and all nails are thickened on both feet and dystrophic with pain     Assessment:  Ingrown toenail deformity left hallux on the medial side along with nail thickness of beds 2 through 5 both feet dystrophic and painful     Plan:  H&P reviewed conditions and recommended removal of spicule explained the procedure and patient signed consent form.  I infiltrated the left hallux 60 mg Xylocaine Marcaine mixture I removed the medial border and applied phenol 3 applications 30 seconds followed by alcohol lavage and sterile dressing.  I then debrided nailbeds 1 through 5 right and 2 through 5 left with no bleeding noted

## 2022-01-20 ENCOUNTER — Other Ambulatory Visit: Payer: Self-pay

## 2022-01-20 DIAGNOSIS — I4819 Other persistent atrial fibrillation: Secondary | ICD-10-CM

## 2022-01-20 MED ORDER — METOPROLOL SUCCINATE ER 50 MG PO TB24: 50.0000 mg | ORAL_TABLET | Freq: Every day | ORAL | 0 refills | Status: DC

## 2022-01-20 MED ORDER — DILTIAZEM HCL ER COATED BEADS 240 MG PO CP24: 240.0000 mg | ORAL_CAPSULE | Freq: Every day | ORAL | 0 refills | Status: DC

## 2022-02-22 IMAGING — CT CT ANGIO CHEST
2 of 7 series · 18 of 46 positions shown · IV contrast (omnipaque)
Comparison: Chest x-ray 03/16/2021

CLINICAL DATA: Short of breath. Tachycardia rule out pulmonary
embolism.

EXAM:
CT ANGIOGRAPHY CHEST WITH CONTRAST
TECHNIQUE: Multidetector CT imaging of the chest was performed using the
standard protocol during bolus administration of intravenous
contrast. Multiplanar CT image reconstructions and MIPs were
obtained to evaluate the vascular anatomy.
CONTRAST:  75mL OMNIPAQUE IOHEXOL 350 MG/ML SOLN

[Series 6: thins · axial · 0.77mm/px · z∈[+1190,+1476]mm · 15 of 319 slices shown]
[im 17/319  lung]
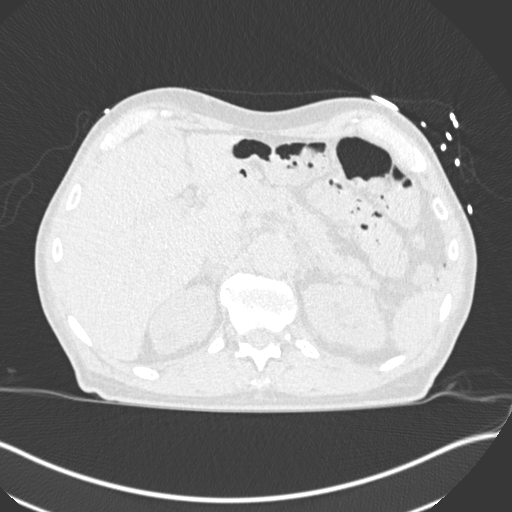
[im 34/319  soft-tissue]
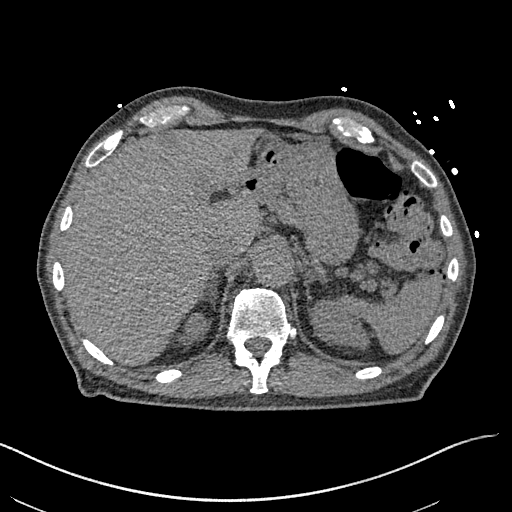
[im 67/319  lung]
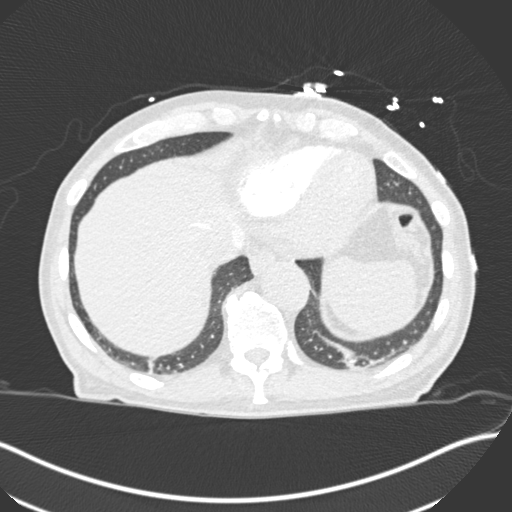
[im 84/319  soft-tissue]
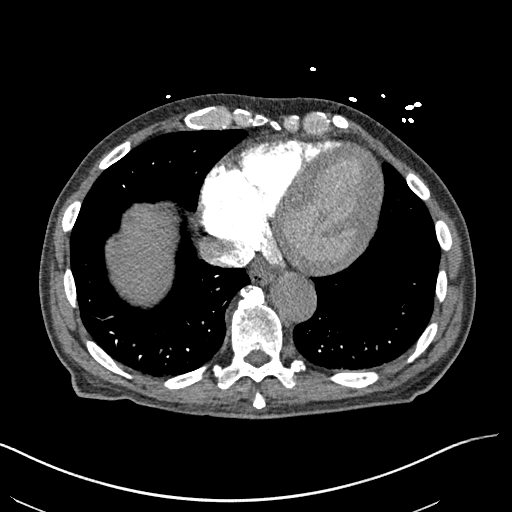
[im 101/319  lung]
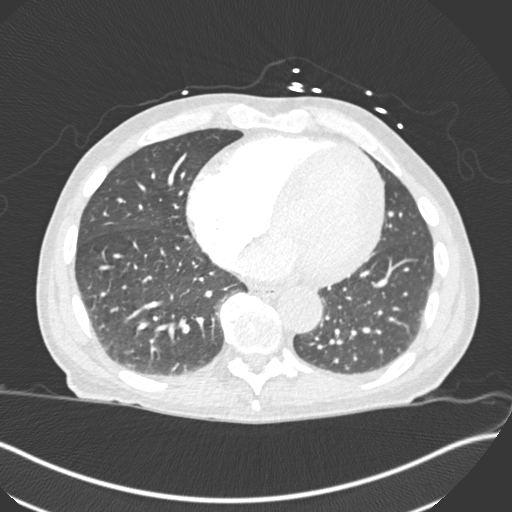
[im 118/319  soft-tissue]
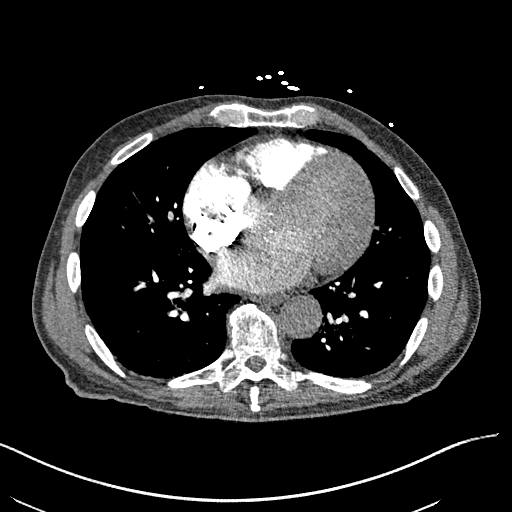
[im 134/319  lung]
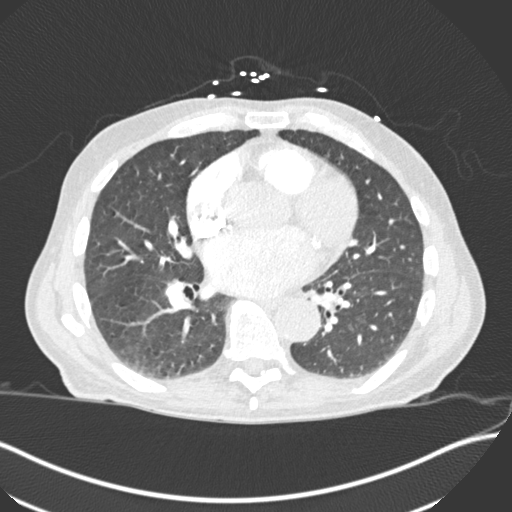
[im 168/319  soft-tissue]
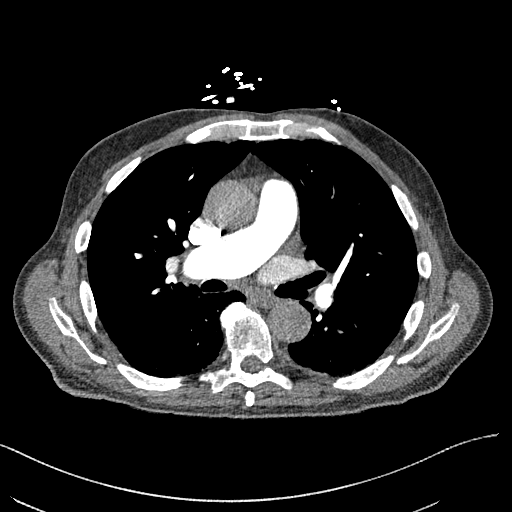
[im 185/319  lung]
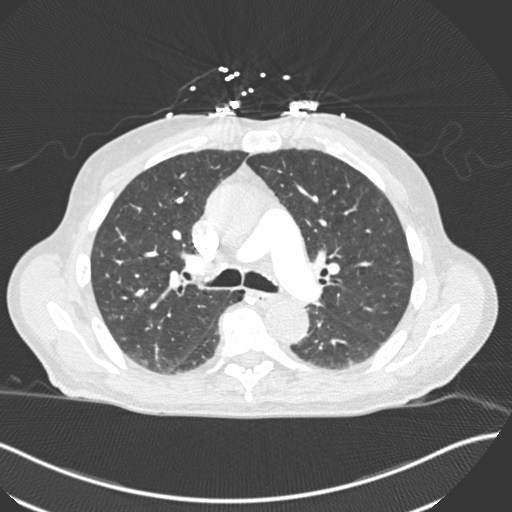
[im 201/319  soft-tissue]
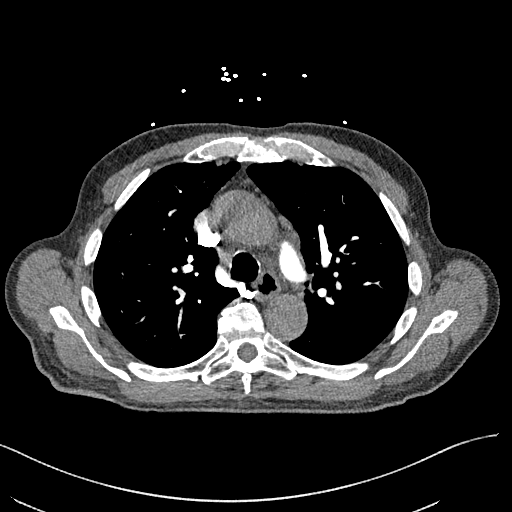
[im 218/319  lung]
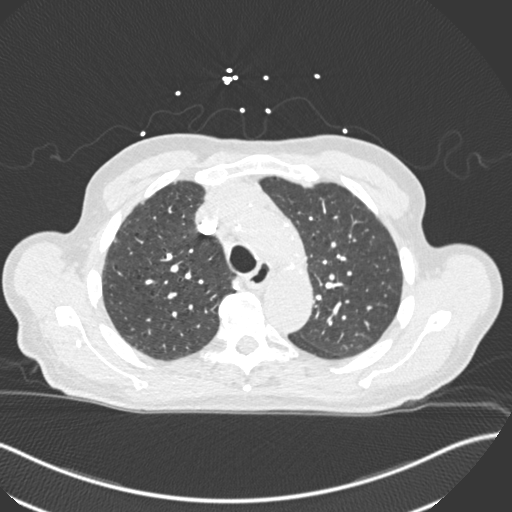
[im 235/319  soft-tissue]
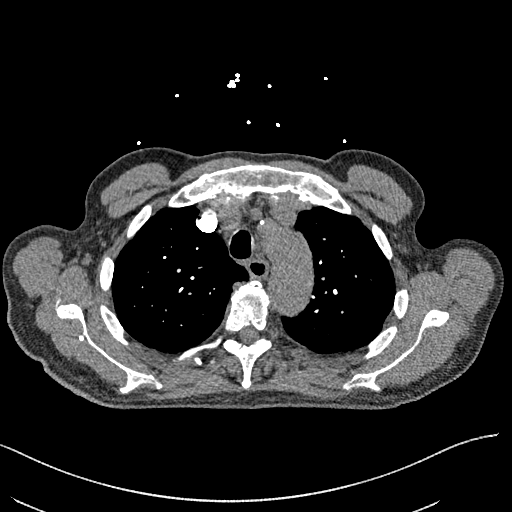
[im 268/319  lung]
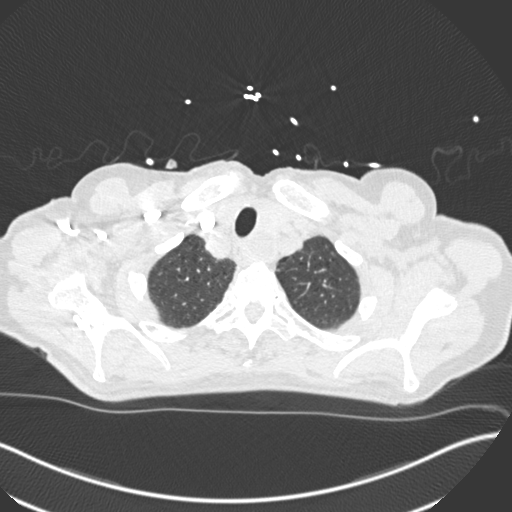
[im 285/319  soft-tissue]
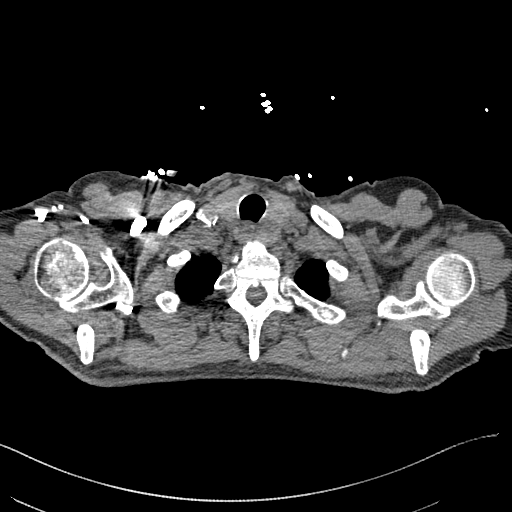
[im 302/319  lung]
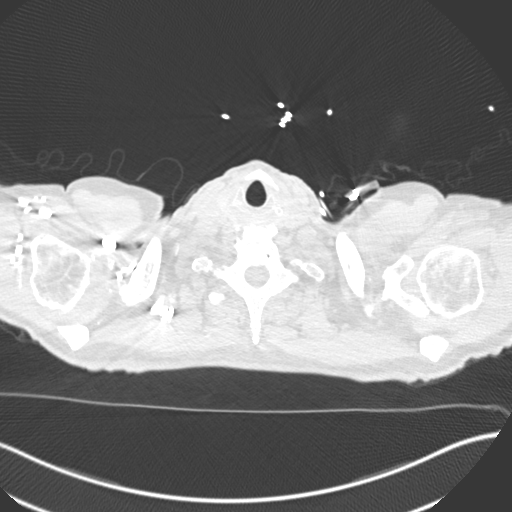

[Series 8: coronal mpr · coronal · 0.65mm/px · 3 of 128 slices shown]
[im 32/128  soft-tissue]
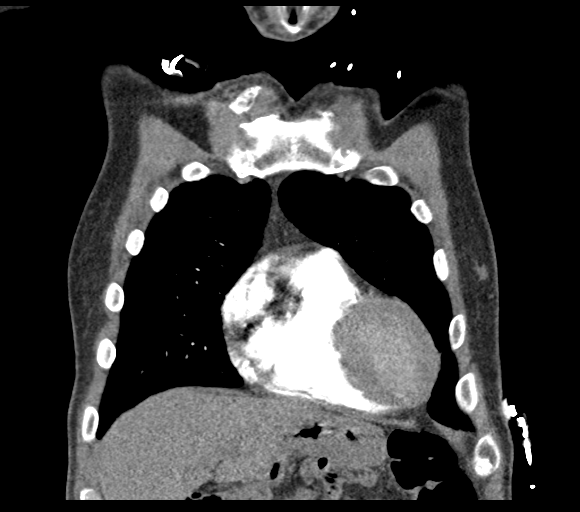
[im 64/128  soft-tissue]
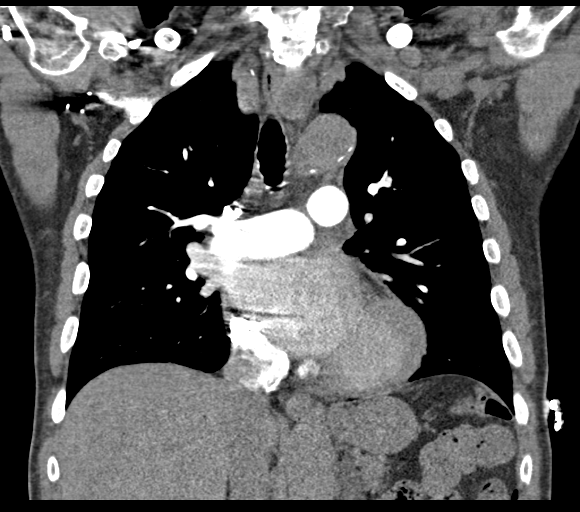
[im 96/128  soft-tissue]
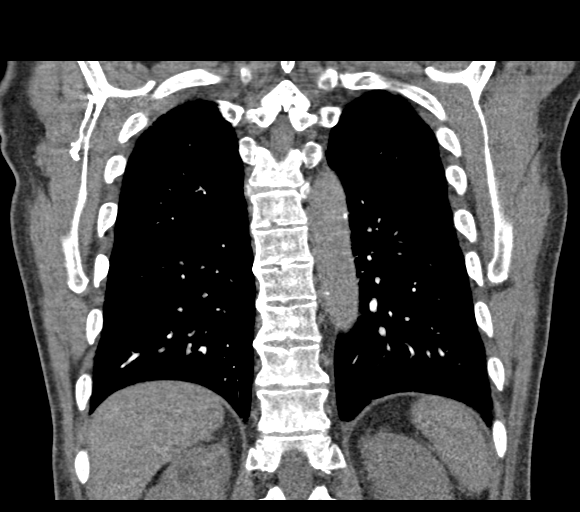

[18 of 46 positions shown; findings below may reference images not displayed]

FINDINGS: Cardiovascular: Satisfactory enhancement of pulmonary arteries.
Negative for pulmonary embolism. No significant enhancement of the
thoracic aorta. There is mild atherosclerotic disease in the aorta
without aneurysm. There is atherosclerotic calcification in the left
coronary artery. No pericardial effusion.

Mediastinum/Nodes: Left paratracheal mass measuring up to 25 mm in
diameter. This is low density centrally and may be attached to the
posterior left thyroid. No other mediastinal mass or adenopathy
identified.

Lungs/Pleura: Calcified granuloma left lower lobe. No acute
infiltrate or effusion. Negative for mass lesion.

Upper Abdomen: Negative

Musculoskeletal: Thoracic disc degeneration and anterior spurring.
No acute skeletal abnormality.

Review of the MIP images confirms the above findings.
IMPRESSION: Negative for pulmonary embolism.

Atherosclerotic calcification aortic arch and coronary arteries.

Left paratracheal mass measuring up to 25 mm in diameter. Favor
exophytic thyroid mass however necrotic lymph node also possible.
Recommend thyroid ultrasound. (Ref: [HOSPITAL]. [id number]

## 2022-04-04 ENCOUNTER — Other Ambulatory Visit: Payer: Self-pay | Admitting: Cardiology

## 2022-04-04 DIAGNOSIS — I4819 Other persistent atrial fibrillation: Secondary | ICD-10-CM

## 2022-07-20 ENCOUNTER — Emergency Department (HOSPITAL_COMMUNITY)
Admission: EM | Admit: 2022-07-20 | Discharge: 2022-07-20 | Discharge status: Against Medical Advice | Payer: Medicare PPO | Attending: Emergency Medicine | Admitting: Emergency Medicine

## 2022-07-20 ENCOUNTER — Emergency Department (HOSPITAL_COMMUNITY): Payer: Medicare PPO

## 2022-07-20 ENCOUNTER — Other Ambulatory Visit: Payer: Self-pay

## 2022-07-20 DIAGNOSIS — I480 Paroxysmal atrial fibrillation: Secondary | ICD-10-CM | POA: Diagnosis not present

## 2022-07-20 DIAGNOSIS — R079 Chest pain, unspecified: Secondary | ICD-10-CM | POA: Diagnosis not present

## 2022-07-20 DIAGNOSIS — Z5321 Procedure and treatment not carried out due to patient leaving prior to being seen by health care provider: Secondary | ICD-10-CM | POA: Insufficient documentation

## 2022-07-20 DIAGNOSIS — R42 Dizziness and giddiness: Secondary | ICD-10-CM | POA: Diagnosis not present

## 2022-07-20 DIAGNOSIS — R110 Nausea: Secondary | ICD-10-CM | POA: Diagnosis present

## 2022-07-20 LAB — BASIC METABOLIC PANEL
Anion gap: 8 (ref 5–15)
BUN: 11 mg/dL (ref 8–23)
CO2: 27 mmol/L (ref 22–32)
Calcium: 9.7 mg/dL (ref 8.9–10.3)
Chloride: 104 mmol/L (ref 98–111)
Creatinine, Ser: 0.97 mg/dL (ref 0.61–1.24)
GFR, Estimated: >60 mL/min (ref >60)
Glucose, Bld: 117 mg/dL — ABNORMAL HIGH (ref 70–99)
Potassium: 4 mmol/L (ref 3.5–5.1)
Sodium: 139 mmol/L (ref 135–145)

## 2022-07-20 LAB — CBC
HCT: 42.7 % (ref 39.0–52.0)
Hemoglobin: 14.7 g/dL (ref 13.0–17.0)
MCH: 31.2 pg (ref 26.0–34.0)
MCHC: 34.4 g/dL (ref 30.0–36.0)
MCV: 90.7 fL (ref 80.0–100.0)
Platelets: 265 10*3/uL (ref 150–400)
RBC: 4.71 MIL/uL (ref 4.22–5.81)
RDW: 13.2 % (ref 11.5–15.5)
WBC: 13 10*3/uL — ABNORMAL HIGH (ref 4.0–10.5)
nRBC: 0 % (ref 0.0–0.2)

## 2022-07-20 LAB — TROPONIN I (HIGH SENSITIVITY): Troponin I (High Sensitivity): 14 ng/L (ref <18)

## 2022-07-20 NOTE — ED Notes (Signed)
PATIENT LEFT AMA 

## 2022-07-20 NOTE — ED Provider Triage Note (Signed)
Emergency Medicine Provider Triage Evaluation Note  Jermaine Wilson , a 71 y.o. male  was evaluated in triage.  Pt complains of nausea and dizziness described as a lightheadedness.  Symptoms ordered approximately 2 hours prior to arrival.  Patient was sitting at his computer when he felt symptoms.  He had mild, very fleeting, left chest pain without radiation.  No associated vomiting or diaphoresis.  No shortness of breath or leg swelling.  Patient has been in his normal state of health over the past several days and has been eating and drinking well.  He has a history of paroxysmal atrial fibrillation.  Review of Systems  Positive: Lightheadedness, nausea Negative: Fever  Physical Exam  BP (!) 153/83 (BP Location: Left Arm)   Pulse 90   Temp 98.8 F (37.1 C) (Oral)   Resp 17   SpO2 98%  Gen:   Awake, no distress   Resp:  Normal effort  MSK:   Moves extremities without difficulty  Other:  Heart regular rhythm  Medical Decision Making  Medically screening exam initiated at 10:34 AM.  Appropriate orders placed.  DENIZ HANNAN was informed that the remainder of the evaluation will be completed by another provider, this initial triage assessment does not replace that evaluation, and the importance of remaining in the ED until their evaluation is complete.     Renne Crigler, PA-C 07/20/22 1035

## 2022-07-20 NOTE — ED Triage Notes (Signed)
Pt. Stated, Jermaine Wilson had some severe dizziness this morning. I ate and it was still there.

## 2022-07-22 ENCOUNTER — Other Ambulatory Visit: Payer: Self-pay | Admitting: Cardiology

## 2022-07-22 ENCOUNTER — Ambulatory Visit: Payer: Medicare PPO | Admitting: Cardiology

## 2022-07-22 ENCOUNTER — Encounter: Payer: Self-pay | Admitting: Cardiology

## 2022-07-22 ENCOUNTER — Ambulatory Visit (HOSPITAL_COMMUNITY)
Admission: RE | Admit: 2022-07-22 | Discharge: 2022-07-22 | Disposition: A | Discharge status: Home / Self Care | Payer: Medicare PPO | Source: Ambulatory Visit | Attending: Cardiology | Admitting: Cardiology

## 2022-07-22 VITALS — BP 167/95 | HR 94 | Temp 97.8°F | Resp 16 | Ht 68.0 in | Wt 201.6 lb

## 2022-07-22 DIAGNOSIS — R202 Paresthesia of skin: Secondary | ICD-10-CM | POA: Diagnosis present

## 2022-07-22 DIAGNOSIS — R42 Dizziness and giddiness: Secondary | ICD-10-CM | POA: Insufficient documentation

## 2022-07-22 MED ORDER — IOHEXOL 350 MG/ML SOLN
75.0000 mL | Freq: Once | INTRAVENOUS | Status: AC | PRN
  Administered 2022-07-22: 75 mL via INTRAVENOUS

## 2022-07-22 MED ORDER — SODIUM CHLORIDE (PF) 0.9 % IJ SOLN
INTRAMUSCULAR | Status: AC
  Filled 2022-07-22: qty 50

## 2022-07-22 NOTE — Addendum Note (Signed)
Addended by: Elder Negus on: 07/22/2022 01:32 PM   Modules accepted: Orders

## 2022-07-22 NOTE — Addendum Note (Signed)
Addended by: Elder Negus on: 07/22/2022 01:34 PM   Modules accepted: Orders

## 2022-07-22 NOTE — Addendum Note (Signed)
Addended by: Elder Negus on: 07/22/2022 01:47 PM   Modules accepted: Orders

## 2022-07-22 NOTE — Addendum Note (Signed)
Addended by: Elder Negus on: 07/22/2022 02:08 PM   Modules accepted: Orders

## 2022-07-22 NOTE — Progress Notes (Addendum)
Follow up visit  Subjective:   Jermaine Wilson, male    DOB: 05-23-1951, 71 y.o.   MRN: [id number]   HPI   Chief Complaint  Patient presents with   Dizziness     71 y.o. Caucasian male smoker, persistent A. fib, multinodular goiter, with acute dizziness and left arm tingling.  Patient was previously seen by me in 2022 at the time of new diagnosis of A-fib, and ongoing work-up for thyroid nodule.  CHA2DS2VASc score was 1.  Given upcoming FNA biopsy then, we had decided to hold off anticoagulation.  Today, patient should have appointment to see me for acute complaints of dizziness and left arm.  Reportedly had been having dizziness since yesterday, went to ER but left AMA without completion of work-up.  Dizziness improved yesterday, evaluated again this morning.  In addition, he started having left arm tingling 2:30 PM.  At that point, I decided to make an urgent visit to our office.    At this time, patient continues to have dizziness and left arm tingling.  She denies any other weakness, numbness symptoms.  He denies any chest pain.  He reports that yesterday, he had a quick "thump" like sensation in left side of his chest, that completely resolved since.     Current Outpatient Medications:    aspirin EC 81 MG tablet, Take 81 mg by mouth daily. Swallow whole., Disp: , Rfl:    cholecalciferol (VITAMIN D) 25 MCG (1000 UT) tablet, Take 1,000 Units by mouth daily., Disp: , Rfl:    diltiazem (CARDIZEM CD) 240 MG 24 hr capsule, TAKE 1 CAPSULE EVERY DAY (NEED MD APPOINTMENT), Disp: 90 capsule, Rfl: 0   metoprolol succinate (TOPROL-XL) 50 MG 24 hr tablet, TAKE 1 TABLET DAILY WITH OR IMMEDIATELY FOLLOWING A MEAL. (NEED MD APPT), Disp: 90 tablet, Rfl: 0   Multiple Vitamins-Minerals (MULTIVITAMIN WITH MINERALS) tablet, Take 1 tablet by mouth daily., Disp: , Rfl:    Omega-3 Fatty Acids (FISH OIL) 1000 MG CAPS, Take 1 capsule by mouth daily., Disp: , Rfl:    tiZANidine (ZANAFLEX) 4 MG tablet,  Take 4 mg by mouth 2 (two) times daily., Disp: , Rfl:     Cardiovascular & other pertient studies:  EKG 07/22/2022: Sinus rhythm 80 bpm  Right bundle branch block. Anterior and inferior ST depression, consider ischemia  Echocardiogram 04/01/2021:  Mildly depressed LV systolic function with visual EF 40-45%. Left  ventricle cavity is normal in size. Unable to evaluate diastolic function  due to atrial fibrillation. Normal LAP. Left ventricle regional wall  motion findings: Basal anteroseptal, Basal inferoseptal, Mid inferior and  Apical inferior hypokinesis. Basal inferolateral and Basal inferior  akinesis.  Left atrial cavity is mildly dilated.  Right atrial cavity is mildly dilated.  Mild (Grade I) aortic regurgitation.  Mild (Grade I) mitral regurgitation.  Mild tricuspid regurgitation.  Insignificant pericardial effusion. There is no hemodynamic significance.  No prior study for comparison.  EKG 03/24/2021: Atrial fibrillation 81 bpm  Right bundle branch block  EKG 03/16/2021: A. fib with RVR 156 bpm Right bundle branch block   Repeat EKG 03/16/2021: Possible atypical atrial flutter with variable conduction Ventricular rate 124 bpm Right bundle branch block  CTA Chest 03/16/2021: Negative for pulmonary embolism. Atherosclerotic calcification aortic arch and coronary arteries. 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 Recommend thyroid ultrasound. (Ref: J Am Coll Radiol. 2015 Feb;12(2): 143-50).    Recent  labs: 07/20/2022: Glucose 117, BUN/Cr 11/0.97. EGFR >60. Na/K 139/4.0.  H/H 14/42. MCV 90. Platelets 265  03/16/2021: Glucose 115, BUN/Cr 11/1.0. EGFR >60. Na/K 138/4.1. Rest of the CMP normal H/H 12.8/39.4. MCV 93.8. Platelets 367 HbA1C M/A Chol 235, TG 115, HDL 40, LDL 172 TSH 0.5 normal, free T4 1.18 (mildly elevated)    Review of Systems  Cardiovascular:  Negative for chest  pain, dyspnea on exertion, leg swelling, palpitations and syncope.  Neurological:  Positive for dizziness and paresthesias.         Vitals:   07/22/22 1236 07/22/22 1237  BP:    Pulse:    Resp:    Temp:    SpO2: 98% 97%   Orthostatic VS for the past 72 hrs (Last 3 readings):  Orthostatic BP Patient Position BP Location Cuff Size Orthostatic Pulse  07/22/22 1237 144/71 Standing Left Arm Large 80  07/22/22 1236 156/77 Sitting Left Arm Large 75  07/22/22 1235 149/79 Supine Left Arm Large 77     Body mass index is 30.65 kg/m. Filed Weights   07/22/22 1234  Weight: 201 lb 9.6 oz (91.4 kg)     Objective:   Physical Exam Vitals and nursing note reviewed.  Constitutional:      General: He is not in acute distress. Neck:     Vascular: No JVD.  Cardiovascular:     Rate and Rhythm: Tachycardia present. Rhythm irregular.     Heart sounds: Normal heart sounds. No murmur heard. Pulmonary:     Effort: Pulmonary effort is normal.     Breath sounds: Normal breath sounds. No wheezing or rales.  Musculoskeletal:     Right lower leg: No edema.     Left lower leg: No edema.          Assessment & Recommendations:    71 y.o. Caucasian male smoker, persistent A. fib, multinodular goiter, with acute dizziness and left arm tingling.   Acute dizziness, left arm tingling: Symptoms of  new onset dizziness arm tingling are concerning for stroke.  Physical exam is unremarkable and does not show any focal neurodeficit. However, given his cardiovascular risk factors and history of A-fib, I recommend the patient to be evaluated urgently in the emergency room, echo stat CT scan for stroke evaluation.  Ideally, this would be best done if transported by EMS.  However, patient is steadfast that he does not want to go to ER, and does not want to be transported by EMS.  He understands the risks involved of degeneration of his symptoms, posing a life-threatening situation to him as well as others  on the road. As second best alternative option, I would recommend a stat code stroke CT head/CTA head/neck with radiology, should the patient not go to ER. I strongly recommend patient not to drive and have someone (Wife/son) drive him to the CT scan. Given concern for stroke, would hold off making any changes to his antihypertensive regimen.  I will also hold off starting anticoagulation unless any bleed is excluded. Continue Aspirin 81 mg daily for now.  Persistent A. Fib: Diagnosed in 2022 in the setting of thyroid nodule.   In sinus rhythm today, on metoprolol succinate 50 mg daily, diltiazem 240 mg daily. CHA2DS2-VASc score 1, annual stroke risk 0.6%.  We will need to revisit discussion regarding anticoagulation after stroke work-up is overall, and as his CHA2DS2-VASc score increases with age.   Mixed hyperlipidemia: LDL 172 in 2022. Will need treatment after acute issues are over  F/u in 4 weeks    Nigel Mormon, MD Pager: [phone] Office: [phone]

## 2022-07-22 NOTE — Progress Notes (Signed)
Called and spoke with patient regarding his CT results.

## 2022-07-22 NOTE — Progress Notes (Signed)
No stroke on CT head. Consider f/u w/PCP for possible vertigo.  Thanks MJP

## 2022-07-22 NOTE — Addendum Note (Signed)
Addended by: Elder Negus on: 07/22/2022 01:56 PM   Modules accepted: Orders

## 2022-08-20 ENCOUNTER — Ambulatory Visit: Payer: Medicare PPO | Admitting: Cardiology

## 2022-08-30 ENCOUNTER — Other Ambulatory Visit: Payer: Self-pay | Admitting: Cardiology

## 2022-08-30 DIAGNOSIS — I4819 Other persistent atrial fibrillation: Secondary | ICD-10-CM

## 2023-08-31 ENCOUNTER — Other Ambulatory Visit: Payer: Self-pay | Admitting: Cardiology

## 2023-08-31 DIAGNOSIS — I4819 Other persistent atrial fibrillation: Secondary | ICD-10-CM

## 2023-09-22 ENCOUNTER — Other Ambulatory Visit: Payer: Self-pay | Admitting: Cardiology

## 2023-09-22 DIAGNOSIS — I4819 Other persistent atrial fibrillation: Secondary | ICD-10-CM

## 2024-10-01 ENCOUNTER — Encounter: Payer: Self-pay | Admitting: Podiatry

## 2024-10-01 ENCOUNTER — Ambulatory Visit: Admitting: Podiatry

## 2024-10-01 VITALS — Ht 68.0 in | Wt 200.0 lb

## 2024-10-01 DIAGNOSIS — L600 Ingrowing nail: Secondary | ICD-10-CM

## 2024-10-01 DIAGNOSIS — M79675 Pain in left toe(s): Secondary | ICD-10-CM | POA: Diagnosis not present

## 2024-10-01 DIAGNOSIS — B351 Tinea unguium: Secondary | ICD-10-CM

## 2024-10-01 DIAGNOSIS — M79674 Pain in right toe(s): Secondary | ICD-10-CM | POA: Diagnosis not present

## 2024-10-01 NOTE — Patient Instructions (Signed)

## 2024-10-03 NOTE — Progress Notes (Signed)
 Subjective:   Patient ID: Jermaine Wilson, male   DOB: 73 y.o.   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  Debrided nailbeds 2 through 5 right 1 through 5 left no iatrogenic bleeding reappoint routine care
